# Patient Record
Sex: Male | Born: 1940 | Hispanic: No | Marital: Single | Smoking: Never smoker
Health system: Southern US, Community
[De-identification: ages and names within clinical notes are randomized; demographics above are authoritative.]

## PROBLEM LIST (undated history)

## (undated) DIAGNOSIS — I10 Essential (primary) hypertension: Secondary | ICD-10-CM

## (undated) DIAGNOSIS — I82409 Acute embolism and thrombosis of unspecified deep veins of unspecified lower extremity: Secondary | ICD-10-CM

## (undated) DIAGNOSIS — I509 Heart failure, unspecified: Secondary | ICD-10-CM

## (undated) DIAGNOSIS — I2699 Other pulmonary embolism without acute cor pulmonale: Secondary | ICD-10-CM

## (undated) DIAGNOSIS — IMO0001 Reserved for inherently not codable concepts without codable children: Secondary | ICD-10-CM

## (undated) DIAGNOSIS — C6990 Malignant neoplasm of unspecified site of unspecified eye: Secondary | ICD-10-CM

## (undated) DIAGNOSIS — H35371 Puckering of macula, right eye: Secondary | ICD-10-CM

## (undated) HISTORY — DX: Malignant neoplasm of unspecified site of unspecified eye: C69.90

## (undated) HISTORY — PX: CATARACT EXTRACTION: SUR2

## (undated) HISTORY — DX: Other pulmonary embolism without acute cor pulmonale: I26.99

## (undated) HISTORY — DX: Acute embolism and thrombosis of unspecified deep veins of unspecified lower extremity: I82.409

## (undated) HISTORY — PX: LUMBAR SPINE SURGERY: SHX701

## (undated) HISTORY — DX: Puckering of macula, right eye: H35.371

---

## 2003-07-22 DIAGNOSIS — C6990 Malignant neoplasm of unspecified site of unspecified eye: Secondary | ICD-10-CM

## 2003-07-22 HISTORY — DX: Malignant neoplasm of unspecified site of unspecified eye: C69.90

## 2005-04-10 ENCOUNTER — Other Ambulatory Visit: Admission: RE | Admit: 2005-04-10 | Discharge: 2005-04-10 | Payer: Self-pay | Admitting: Dermatology

## 2016-01-04 ENCOUNTER — Inpatient Hospital Stay (HOSPITAL_COMMUNITY)
Admission: AD | Admit: 2016-01-04 | Discharge: 2016-01-08 | DRG: 175 | Disposition: A | Discharge status: Skilled Nursing Facility | Payer: Medicare Other | Source: Other Acute Inpatient Hospital | Attending: Internal Medicine | Admitting: Internal Medicine

## 2016-01-04 DIAGNOSIS — I2699 Other pulmonary embolism without acute cor pulmonale: Principal | ICD-10-CM | POA: Diagnosis present

## 2016-01-04 DIAGNOSIS — I2489 Other forms of acute ischemic heart disease: Secondary | ICD-10-CM | POA: Diagnosis present

## 2016-01-04 DIAGNOSIS — R0902 Hypoxemia: Secondary | ICD-10-CM | POA: Diagnosis not present

## 2016-01-04 DIAGNOSIS — E876 Hypokalemia: Secondary | ICD-10-CM | POA: Diagnosis present

## 2016-01-04 DIAGNOSIS — R0600 Dyspnea, unspecified: Secondary | ICD-10-CM | POA: Diagnosis not present

## 2016-01-04 DIAGNOSIS — R739 Hyperglycemia, unspecified: Secondary | ICD-10-CM | POA: Diagnosis present

## 2016-01-04 DIAGNOSIS — I440 Atrioventricular block, first degree: Secondary | ICD-10-CM | POA: Diagnosis present

## 2016-01-04 DIAGNOSIS — I82401 Acute embolism and thrombosis of unspecified deep veins of right lower extremity: Secondary | ICD-10-CM | POA: Diagnosis present

## 2016-01-04 DIAGNOSIS — R7989 Other specified abnormal findings of blood chemistry: Secondary | ICD-10-CM | POA: Diagnosis present

## 2016-01-04 DIAGNOSIS — Z79899 Other long term (current) drug therapy: Secondary | ICD-10-CM

## 2016-01-04 DIAGNOSIS — I10 Essential (primary) hypertension: Secondary | ICD-10-CM | POA: Diagnosis not present

## 2016-01-04 DIAGNOSIS — R7303 Prediabetes: Secondary | ICD-10-CM | POA: Diagnosis present

## 2016-01-04 DIAGNOSIS — I248 Other forms of acute ischemic heart disease: Secondary | ICD-10-CM | POA: Diagnosis present

## 2016-01-04 DIAGNOSIS — J9601 Acute respiratory failure with hypoxia: Secondary | ICD-10-CM | POA: Diagnosis present

## 2016-01-04 DIAGNOSIS — H269 Unspecified cataract: Secondary | ICD-10-CM | POA: Diagnosis present

## 2016-01-04 DIAGNOSIS — R0602 Shortness of breath: Secondary | ICD-10-CM | POA: Diagnosis not present

## 2016-01-04 DIAGNOSIS — J810 Acute pulmonary edema: Secondary | ICD-10-CM | POA: Diagnosis not present

## 2016-01-04 DIAGNOSIS — R778 Other specified abnormalities of plasma proteins: Secondary | ICD-10-CM | POA: Diagnosis present

## 2016-01-04 HISTORY — DX: Essential (primary) hypertension: I10

## 2016-01-04 HISTORY — DX: Reserved for inherently not codable concepts without codable children: IMO0001

## 2016-01-04 HISTORY — DX: Heart failure, unspecified: I50.9

## 2016-01-04 LAB — MRSA PCR SCREENING: MRSA by PCR: NEGATIVE

## 2016-01-04 MED ORDER — FUROSEMIDE 10 MG/ML IJ SOLN
20.0000 mg | Freq: Once | INTRAMUSCULAR | Status: AC
  Administered 2016-01-05: 20 mg via INTRAVENOUS
  Filled 2016-01-04: qty 2

## 2016-01-04 MED ORDER — HEPARIN (PORCINE) IN NACL 100-0.45 UNIT/ML-% IJ SOLN
1950.0000 [IU]/h | INTRAMUSCULAR | Status: DC
  Administered 2016-01-05 (×2): 1800 [IU]/h via INTRAVENOUS
  Filled 2016-01-04: qty 250

## 2016-01-04 MED ORDER — SODIUM CHLORIDE 0.9 % IV SOLN
INTRAVENOUS | Status: DC
  Administered 2016-01-04 – 2016-01-07 (×3): via INTRAVENOUS

## 2016-01-04 MED ORDER — ACETAMINOPHEN 325 MG PO TABS: 650.0000 mg | ORAL_TABLET | Freq: Four times a day (QID) | ORAL | Status: DC | PRN

## 2016-01-04 NOTE — Progress Notes (Signed)
ANTICOAGULATION CONSULT NOTE - Initial Consult  Pharmacy Consult for Heparin Indication: pulmonary embolus  No Known Allergies  Patient Measurements: Height: 6\' 1"  (185.4 cm) Weight: 250 lb (113.399 kg) IBW/kg (Calculated) : 79.9 Heparin Dosing Weight:  104 kg  Vital Signs:    Labs: No results for input(s): HGB, HCT, PLT, APTT, LABPROT, INR, HEPARINUNFRC, HEPRLOWMOCWT, CREATININE, CKTOTAL, CKMB, TROPONINI in the last 72 hours.  CrCl cannot be calculated (Patient has no serum creatinine result on file.).   Medical History: No past medical history on file.  Medications:  See med rec  Assessment: Pt presented to [facility] (from [facility] rehab) via EMS c/o SOB.  Patient had recent back surgery June 5 and has been in rehab.  Estensive PE with R heart strain.Received Lovenox 113mg  6/16 at 0955. Then patient was placed on a heparin drip between 1730-1800 at a rate of 1800 units/hr prior to transfer. Patient transferred to [facility] for evaluation of EKOS.  PMH: HTN, neruopathy, macular pucker, cataracts, R eye cancer,   Labs from [facility]: Scr 1.01, Alkphos 95 high, Ca 8.2 (albumin 3.1), Glucose 173, WBC 16.4, H/H 13.5/39.5, Plts 227, BNP 3967   Goal of Therapy:  Heparin level 0.3-0.7 units/ml Monitor platelets by anticoagulation protocol: Yes   Plan:  Continue heparin at 1800 units/hr (17 units/kg/hr) Will check heparin level at 0100 Daily HL and CBC F/u evaluation for EKOS   Hilberto Burzynski S. Alford Highland, PharmD, BCPS Clinical Staff Pharmacist Pager [phone]  Eilene Ghazi Stillinger 01/04/2016,8:15 PM

## 2016-01-04 NOTE — H&P (Signed)
PULMONARY / CRITICAL CARE MEDICINE   Name: Liban Joya MRN: [id number] DOB: 08-09-1940    ADMISSION DATE:  01/04/2016 CONSULTATION DATE:  01/04/16  REFERRING MD:  n/a  CHIEF COMPLAINT:  Shortness of breath  HISTORY OF PRESENT ILLNESS:   Mr. Urrego is a pleasant 79M who presents as a transfer from OSH for shortness of breath and acute PE with imaging findings concerning for right heart strain. He reports that he had spinal surgery (L3-4 laminectomy it sounds like) 11 days ago. Post-operatively he went to a sub-acute rehab facility where he was doing well until 6/14, when there was an abrupt change and he was not able to walk the hallway using a walker as he had been doing up to that point. Over the next 48h he had increasing shortness of breath. Over the last week he noted increasing bilateral lower extremity edema. He was so short of breath today that he couldn't get out of bed. Nursing staff noted sats in the 80s and called EMS. At the outside hospital CTA PE was done which revealed extensive bilateral pulmonary emboli. BNP was markedly elevated at 3967, but troponin was <0.01. He was started on heparin and transferred here for consideration of advanced therapies.  He denies any syncopal event or loss of consciousness. At no time was he told his blood pressure was low. He is currently mildly tachycardic in the low 100s, hemodynamically stable and saturating well on 3L [place]. He denies pain with deep inspiration. No fever / chills / nausea / vomiting. He has been a little constipated. No urinary issues. No neurologic symptoms. No hemoptysis.   PAST MEDICAL HISTORY :  He  has no past medical history on file.  HTN  PAST SURGICAL HISTORY: He  has no past surgical history on file.  Lumbar laminectomy Appendectomy  Left inguinal hernia repair Cataract R eye  No Known Allergies  No current facility-administered medications on file prior to encounter.   No current outpatient prescriptions on  file prior to encounter.  Folic acid 400mg  daily Gabapentin 300 mg po BID Lisinopril 10 mg daily Vitamin d3 1000 unit daily Vitamin c daily Zinc gluconate 50 mg daily MVI daily Tylenol 650mg  as needed Oxycodone-APAP 5/325 ii tab q6h po prn Docusate Milk of mag   FAMILY HISTORY:  His has no family status information on file.  No smoking, alcohol, illicits  SOCIAL HISTORY: He  is married, lives with his wife. Active.   REVIEW OF SYSTEMS:   General: intermittent chills. no weight change, no fever,  Cardiovascular: mild chest discomfort with coughing. no palpitations, orthopnea, or PND Respiratory: cough. no sputum production, shortness of breath Gastrointestinal: + constipation no nausea, vomiting, diarrhea,  Genitourinary: no pain with urination, no foul odor, no frequency, no urgency Musculoskeletal: no joint pain or swelling, no muscle aches Skin: no rashes, sores, or ulcers Endocrine: no blood sugar problems, no thyroid problems Neurologic: no numbness, tingling, dizziness, or visual changes Hematologic: no bleeding, bruising, or clotting problems; no prior dvt Psychiatric: no depression or anxiety, no suicidal ideation or intent   SUBJECTIVE:  See HPI  VITAL SIGNS: BP 149/74 mmHg  Pulse 100  Temp(Src) 97.7 F (36.5 C) (Oral)  Resp 26  Ht 6\' 1"  (1.854 m)  Wt 113.399 kg (250 lb)  BMI 32.99 kg/m2  SpO2 95%  HEMODYNAMICS:    VENTILATOR SETTINGS:    INTAKE / OUTPUT:    PHYSICAL EXAMINATION:  General Well nourished, well developed, no apparent distress  HEENT No  gross abnormalities. Oropharynx clear. Mallampati III. Adequate dentition.   Pulmonary Clear to auscultation bilaterally with no wheezes, rales or ronchi. Good effort, symmetrical expansion.   Cardiovascular Mildly tachy (100s), regular rhythm. S1, s2. No m/r/g. Distal pulses palpable.  Abdomen Soft, non-tender, non-distended, positive bowel sounds, no palpable organomegaly or masses. Normoresonant  to percussion.  Musculoskeletal Moves all extremities. No gross abnormalities. Lumbar surgical site not evaluated.  Lymphatics No cervical, supraclavicular or axillary adenopathy.   Neurologic Grossly intact. No focal deficits. Strength 5/5 throughout.   Skin/Integuement No rash, no cyanosis, no clubbing. 1+ edema bilateral lower extremities to knees.     LABS:  pending  BMET No results for input(s): NA, K, CL, CO2, BUN, CREATININE, GLUCOSE in the last 168 hours.  Electrolytes No results for input(s): CALCIUM, MG, PHOS in the last 168 hours.  CBC No results for input(s): WBC, HGB, HCT, PLT in the last 168 hours.  Coag's No results for input(s): APTT, INR in the last 168 hours.  Sepsis Markers No results for input(s): LATICACIDVEN, PROCALCITON, O2SATVEN in the last 168 hours.  ABG No results for input(s): PHART, PCO2ART, PO2ART in the last 168 hours.  Liver Enzymes No results for input(s): AST, ALT, ALKPHOS, BILITOT, ALBUMIN in the last 168 hours.  Cardiac Enzymes No results for input(s): TROPONINI, PROBNP in the last 168 hours.  Glucose No results for input(s): GLUCAP in the last 168 hours.  Imaging No results found.   STUDIES:  CTA PE from OSH 6/16 Conclusion:  Acute PE with CT evidence of right heart strain (RV/LV ratio 1.4) consistent with intermediate risk PE. The presence of right heart strain has been associated with an increased risk of morbidity and mortality. PE arises from the proximal left main pulmonary artery and the distal right main pulmonary artery with pulmonary emboli in multiple peripheral branches bilaterally. Right pleural effusion with right base airspace consolidation.  No adenopathy.   CULTURES: None  ANTIBIOTICS: None  SIGNIFICANT EVENTS:   LINES/TUBES: PIV  DISCUSSION: Mr. Mandella is a 76M with subacute pulmonary emboli (per history likely evident since 6/14). CT showed some evidence of right heart strain, echo has yet to be  obtained. He is currently on a heparin gtt. He has significant clot burden, but had spinal surgery less than 2 weeks ago. From the available history, despite his clot burden, he does not fulfill criteria for submassive PE, as there was no hemodynamic instability or equivalent. Given his recent surgery, TPA is relatively contraindicated.   ASSESSMENT / PLAN:  PULMONARY A: Acute hypoxemic respiratory failure 2/2 PE Subacute PE P:   Continue supplemental oxygen Continue therapeutic heparin gtt TTE    CARDIOVASCULAR A:  Acute heart failure exacerbation (BNP >3500); doubt BNP elevation is fully explained by PE, particularly with negative troponin R heart strain by CT criteria Hx HTN P:  Obtain TTE Diurese as tolerated by renal function and hemodynamics  RENAL A:   No acute issues (Cr 1.01 @ OSH, BUN 13) P:   Monitor  GASTROINTESTINAL A:   No acute issues P:   Regular diet  HEMATOLOGIC A:   Acute PE  P:  Continue therapeutic heparin gtt LE dopplers to evaluate for additional clot burden  INFECTIOUS A:   No acute issues; leukocytosis of 16.4 at OSH likely represents stress response.  P:   No abx at this time.  ENDOCRINE A:   No acute issues P:     NEUROLOGIC A:   Recent L3-4 laminectomy (12/24/15) P:  RASS goal: 0 Monitor for s/s of surgical site complications while on heparin gtt   FAMILY  - Updates: No family at bedside; lengthy discussion with patient.   - Inter-disciplinary family meet or Palliative Care meeting due by:  day 7  The patient is critically ill with multiple organ system failure and requires high complexity decision making for assessment and support, frequent evaluation and titration of therapies, advanced monitoring, review of radiographic studies and interpretation of complex data.   Critical Care Time devoted to patient care services, exclusive of separately billable procedures, described in this note is 32 minutes.   Yisroel Ramming, MD Pulmonary and [place] Pager: [phone]  01/04/2016, 10:03 PM

## 2016-01-04 NOTE — Progress Notes (Signed)
Pt received to 2C-5 via carelink on stretcher. Safely transferred to bed and connected to monitor. Lines in tact. Heparin infusing at 18units. Pt GCS 15, HDS, no s/s of resp distress on 3LNC. Please see doc flowsheets for full assessment. This RN will continue to monitor

## 2016-01-05 ENCOUNTER — Encounter (HOSPITAL_COMMUNITY): Payer: Self-pay | Admitting: *Deleted

## 2016-01-05 ENCOUNTER — Inpatient Hospital Stay (HOSPITAL_COMMUNITY): Payer: Medicare Other

## 2016-01-05 DIAGNOSIS — I2699 Other pulmonary embolism without acute cor pulmonale: Principal | ICD-10-CM

## 2016-01-05 DIAGNOSIS — R0902 Hypoxemia: Secondary | ICD-10-CM

## 2016-01-05 DIAGNOSIS — J810 Acute pulmonary edema: Secondary | ICD-10-CM

## 2016-01-05 DIAGNOSIS — R0602 Shortness of breath: Secondary | ICD-10-CM

## 2016-01-05 LAB — CBC
HEMATOCRIT: 36.5 % — AB (ref 39.0–52.0)
HEMOGLOBIN: 11.9 g/dL — AB (ref 13.0–17.0)
MCH: 27.4 pg (ref 26.0–34.0)
MCHC: 32.6 g/dL (ref 30.0–36.0)
MCV: 84.1 fL (ref 78.0–100.0)
Platelets: 204 10*3/uL (ref 150–400)
RBC: 4.34 MIL/uL (ref 4.22–5.81)
RDW: 13.7 % (ref 11.5–15.5)
WBC: 10.6 10*3/uL — ABNORMAL HIGH (ref 4.0–10.5)

## 2016-01-05 LAB — COMPREHENSIVE METABOLIC PANEL
ALBUMIN: 2.3 g/dL — AB (ref 3.5–5.0)
ALT: 84 U/L — ABNORMAL HIGH (ref 17–63)
AST: 87 U/L — ABNORMAL HIGH (ref 15–41)
Alkaline Phosphatase: 87 U/L (ref 38–126)
Anion gap: 9 (ref 5–15)
BILIRUBIN TOTAL: 0.8 mg/dL (ref 0.3–1.2)
BUN: 10 mg/dL (ref 6–20)
CHLORIDE: 104 mmol/L (ref 101–111)
CO2: 24 mmol/L (ref 22–32)
Calcium: 7.7 mg/dL — ABNORMAL LOW (ref 8.9–10.3)
Creatinine, Ser: 0.98 mg/dL (ref 0.61–1.24)
GFR calc Af Amer: >60 mL/min (ref >60)
GFR calc non Af Amer: >60 mL/min (ref >60)
GLUCOSE: 142 mg/dL — AB (ref 65–99)
POTASSIUM: 3.1 mmol/L — AB (ref 3.5–5.1)
SODIUM: 137 mmol/L (ref 135–145)
Total Protein: 5.5 g/dL — ABNORMAL LOW (ref 6.5–8.1)

## 2016-01-05 LAB — ECHOCARDIOGRAM COMPLETE
CHL CUP MV DEC (S): 156
E/e' ratio: 6.56
EWDT: 156 ms
FS: 20 % — AB (ref 28–44)
HEIGHTINCHES: 73 in
IVS/LV PW RATIO, ED: 0.8
LA diam end sys: 37 mm
LADIAMINDEX: 1.43 cm/m2
LASIZE: 37 mm
LAVOL: 57.4 mL
LAVOLA4C: 64.5 mL
LAVOLIN: 22.1 mL/m2
LV TDI E'MEDIAL: 8.49
LV e' LATERAL: 9.46 cm/s
LVEEAVG: 6.56
LVEEMED: 6.56
LVOT area: 4.52 cm2
LVOT diameter: 24 mm
Lateral S' vel: 13.3 cm/s
MV pk E vel: 62.1 m/s
MVPKAVEL: 74.2 m/s
PW: 12.6 mm — AB (ref 0.6–1.1)
RV sys press: 56 mmHg
Reg peak vel: 345 cm/s
TAPSE: 16.9 mm
TDI e' lateral: 9.46
TR max vel: 345 cm/s
WEIGHTICAEL: 4451.2 [oz_av]

## 2016-01-05 LAB — HEPARIN LEVEL (UNFRACTIONATED)
HEPARIN UNFRACTIONATED: 0.49 [IU]/mL (ref 0.30–0.70)
Heparin Unfractionated: 0.4 IU/mL (ref 0.30–0.70)
Heparin Unfractionated: 0.33 IU/mL (ref 0.30–0.70)

## 2016-01-05 LAB — TROPONIN I
Troponin I: 0.07 ng/mL — ABNORMAL HIGH (ref <0.031)
Troponin I: 0.07 ng/mL — ABNORMAL HIGH (ref <0.031)

## 2016-01-05 MED ORDER — PERFLUTREN LIPID MICROSPHERE
1.0000 mL | INTRAVENOUS | Status: AC | PRN
  Administered 2016-01-05: 3 mL via INTRAVENOUS
  Filled 2016-01-05: qty 10

## 2016-01-05 MED ORDER — POTASSIUM CHLORIDE CRYS ER 20 MEQ PO TBCR
40.0000 meq | EXTENDED_RELEASE_TABLET | Freq: Three times a day (TID) | ORAL | Status: AC
  Administered 2016-01-05 (×2): 40 meq via ORAL
  Filled 2016-01-05 (×2): qty 2

## 2016-01-05 MED ORDER — POTASSIUM CHLORIDE CRYS ER 20 MEQ PO TBCR
40.0000 meq | EXTENDED_RELEASE_TABLET | Freq: Once | ORAL | Status: AC
  Administered 2016-01-05: 40 meq via ORAL
  Filled 2016-01-05: qty 2

## 2016-01-05 MED ORDER — FUROSEMIDE 10 MG/ML IJ SOLN
20.0000 mg | Freq: Three times a day (TID) | INTRAMUSCULAR | Status: AC
  Administered 2016-01-05 (×2): 20 mg via INTRAVENOUS
  Filled 2016-01-05 (×3): qty 2

## 2016-01-05 MED ORDER — HEPARIN (PORCINE) IN NACL 100-0.45 UNIT/ML-% IJ SOLN
2050.0000 [IU]/h | INTRAMUSCULAR | Status: DC
  Administered 2016-01-05 – 2016-01-06 (×2): 2050 [IU]/h via INTRAVENOUS
  Filled 2016-01-05 (×2): qty 250

## 2016-01-05 NOTE — Progress Notes (Signed)
ANTICOAGULATION CONSULT NOTE - Follow Up Consult  Pharmacy Consult for heparin Indication: pulmonary embolus  Labs:  Recent Labs  01/04/16 0030  HEPARINUNFRC 0.49     Assessment/Plan:  75yo male therapeutic on heparin with initial dosing for PE (note, lab dated as 6/16 at 0030 but was actually drawn 6/17 0030). Will continue gtt at current rate and confirm stable with additional level.   Wynona Neat, PharmD, BCPS  01/05/2016,1:07 AM

## 2016-01-05 NOTE — Progress Notes (Signed)
[place] Progress Note Patient Name: Mike Davis DOB: 03-Nov-1940 MRN: [id number]   Date of Service  01/05/2016  HPI/Events of Note  hypokalemia  eICU Interventions  Potassium replaced     Intervention Category Intermediate Interventions: Electrolyte abnormality - evaluation and management  Mike Davis 01/05/2016, 4:28 AM

## 2016-01-05 NOTE — Plan of Care (Signed)
Problem: Education: Goal: Knowledge of [place] [facility] Education information/materials will improve Outcome: Progressing Discussed reason for 12 lead EKG being performed.

## 2016-01-05 NOTE — Progress Notes (Signed)
  Echocardiogram 2D Echocardiogram has been performed.  Mike Davis 01/05/2016, 5:45 PM

## 2016-01-05 NOTE — Progress Notes (Signed)
PULMONARY / CRITICAL CARE MEDICINE   Name: Mike Davis MRN: [id number] DOB: Dec 16, 1940    ADMISSION DATE:  01/04/2016 CONSULTATION DATE:  01/04/16  REFERRING MD:  n/a  CHIEF COMPLAINT:  Shortness of breath  HISTORY OF PRESENT ILLNESS:   Mike Davis is a pleasant 52M who presents as a transfer from OSH for shortness of breath and acute PE with imaging findings concerning for right heart strain. He reports that he had spinal surgery (L3-4 laminectomy it sounds like) 11 days ago. Post-operatively he went to a sub-acute rehab facility where he was doing well until 6/14, when there was an abrupt change and he was not able to walk the hallway using a walker as he had been doing up to that point. Over the next 48h he had increasing shortness of breath. Over the last week he noted increasing bilateral lower extremity edema. He was so short of breath today that he couldn't get out of bed. Nursing staff noted sats in the 80s and called EMS. At the outside hospital CTA PE was done which revealed extensive bilateral pulmonary emboli. BNP was markedly elevated at 3967, but troponin was <0.01. He was started on heparin and transferred here for consideration of advanced therapies.  He denies any syncopal event or loss of consciousness. At no time was he told his blood pressure was low. He is currently mildly tachycardic in the low 100s, hemodynamically stable and saturating well on 3L [place]. He denies pain with deep inspiration. No fever / chills / nausea / vomiting. He has been a little constipated. No urinary issues. No neurologic symptoms. No hemoptysis.   SUBJECTIVE:  No events overnight.  VITAL SIGNS: BP 133/78 mmHg  Pulse 96  Temp(Src) 98 F (36.7 C) (Oral)  Resp 27  Ht 6\' 1"  (1.854 m)  Wt 126.191 kg (278 lb 3.2 oz)  BMI 36.71 kg/m2  SpO2 93%  HEMODYNAMICS:    VENTILATOR SETTINGS:    INTAKE / OUTPUT: I/O last 3 completed shifts: In: 753.2 [P.O.:120; I.V.:633.2] Out: 1425  [Urine:1425]  PHYSICAL EXAMINATION:  General Well nourished, well developed, no apparent distress  HEENT No gross abnormalities. Oropharynx clear. Mallampati III. Adequate dentition.   Pulmonary Clear to auscultation bilaterally with no wheezes, rales or ronchi. Good effort, symmetrical expansion.   Cardiovascular Mildly tachy (100s), regular rhythm. S1, s2. No m/r/g. Distal pulses palpable.  Abdomen Soft, non-tender, non-distended, positive bowel sounds, no palpable organomegaly or masses. Normoresonant to percussion.  Musculoskeletal Moves all extremities. No gross abnormalities. Lumbar surgical site not evaluated.  Lymphatics No cervical, supraclavicular or axillary adenopathy.   Neurologic Grossly intact. No focal deficits. Strength 5/5 throughout.   Skin/Integuement No rash, no cyanosis, no clubbing. 1+ edema bilateral lower extremities to knees.     LABS:  pending  BMET  Recent Labs Lab 01/05/16 0245  NA 137  K 3.1*  CL 104  CO2 24  BUN 10  CREATININE 0.98  GLUCOSE 142*    Electrolytes  Recent Labs Lab 01/05/16 0245  CALCIUM 7.7*    CBC  Recent Labs Lab 01/05/16 0245  WBC 10.6*  HGB 11.9*  HCT 36.5*  PLT 204    Coag's No results for input(s): APTT, INR in the last 168 hours.  Sepsis Markers No results for input(s): LATICACIDVEN, PROCALCITON, O2SATVEN in the last 168 hours.  ABG No results for input(s): PHART, PCO2ART, PO2ART in the last 168 hours.  Liver Enzymes  Recent Labs Lab 01/05/16 0245  AST 87*  ALT 84*  ALKPHOS 87  BILITOT 0.8  ALBUMIN 2.3*    Cardiac Enzymes  Recent Labs Lab 01/04/16 0030 01/05/16 0600  TROPONINI 0.07* 0.07*    Glucose No results for input(s): GLUCAP in the last 168 hours.  Imaging No results found.   STUDIES:  CTA PE from OSH 6/16 Conclusion:  Acute PE with CT evidence of right heart strain (RV/LV ratio 1.4) consistent with intermediate risk PE. The presence of right heart strain has been  associated with an increased risk of morbidity and mortality. PE arises from the proximal left main pulmonary artery and the distal right main pulmonary artery with pulmonary emboli in multiple peripheral branches bilaterally. Right pleural effusion with right base airspace consolidation.  No adenopathy.   CULTURES: None  ANTIBIOTICS: None  SIGNIFICANT EVENTS:   LINES/TUBES: PIV  DISCUSSION: Mike Davis is a 6M with subacute pulmonary emboli (per history likely evident since 6/14). CT showed some evidence of right heart strain, echo has yet to be obtained. He is currently on a heparin gtt. He has significant clot burden, but had spinal surgery less than 2 weeks ago. From the available history, despite his clot burden, he does not fulfill criteria for submassive PE, as there was no hemodynamic instability or equivalent. Given his recent surgery, TPA is relatively contraindicated.   ASSESSMENT / PLAN:  PULMONARY A: Acute hypoxemic respiratory failure 2/2 PE Subacute PE P:   Continue supplemental oxygen Continue therapeutic heparin gtt TTE pending  CARDIOVASCULAR A:  Acute heart failure exacerbation (BNP >3500); doubt BNP elevation is fully explained by PE, particularly with negative troponin R heart strain by CT criteria Hx HTN First degree av block on EKG that I reviewed myself. P:  TTE, will reorder Diurese as tolerated by renal function and hemodynamics  RENAL A:   No acute issues (Cr 1.01 @ OSH, BUN 13) P:   Monitor Will give 2 doses of 20 of lasix today (concern for BP and right heart failure) 2 doses of K-dur 40 meq PO given  GASTROINTESTINAL A:   No acute issues P:   Regular diet  HEMATOLOGIC A:   Acute PE  P:  Continue therapeutic heparin gtt LE dopplers to evaluate for additional clot burden, pending Patient to decide on coumadin vs NOAC  INFECTIOUS A:   No acute issues; leukocytosis of 16.4 at OSH likely represents stress response.  P:   No  abx at this time.  ENDOCRINE A:   No acute issues P:   Monitor  NEUROLOGIC A:   Recent L3-4 laminectomy (12/24/15) P:   RASS goal: 0 Monitor for s/s of surgical site complications while on heparin gtt Minimize narcotics  Discussed with PCCM-NP.  Rush Farmer, M.D. [facility] Pulmonary/Critical Care Medicine. Pager: [phone]. After hours pager: [phone].  01/05/2016, 3:32 PM

## 2016-01-05 NOTE — Progress Notes (Signed)
ANTICOAGULATION CONSULT NOTE - FOLLOW UP    HL = 0.33 (goal 0.3 - 0.7 units/mL) Heparin dosing weight = 104 kg   Assessment: [name] with history of spinal surgery about 2 weeks ago and subsequently developed a PE.  Patient continues on IV heparin.  Heparin level remains therapeutic but is trending down.  No bleeding reported.   Plan: - Increase heparin gtt to 2050 units/hr - F/U AM labs    Mike Davis, PharmD, BCPS 01/05/2016, 7:30 PM

## 2016-01-05 NOTE — Plan of Care (Signed)
Problem: Education: Goal: Knowledge of [place] [facility] Education information/materials will improve Outcome: Progressing Discussed the reasons for increasing his Heparin drip rate from pharmacy.  And patient would prefer an air mattress bed for previous back surgery.

## 2016-01-05 NOTE — Progress Notes (Signed)
ANTICOAGULATION CONSULT NOTE - Follow Up Consult  Pharmacy Consult for heparin Indication: pulmonary embolus  Labs:  Recent Labs  01/04/16 0030 01/05/16 0245 01/05/16 0600  HGB  --  11.9*  --   HCT  --  36.5*  --   PLT  --  204  --   HEPARINUNFRC 0.49  --  0.40  CREATININE  --  0.98  --   TROPONINI 0.07*  --  0.07*   Assessment:  75yo male therapeutic on heparin with initial dosing for PE.  He has trended downward since initial dosing.  Would like to see him at the higher end of range with PE.  Reviewed AM labs and his CBC is unremarkable and platelets remain WNL.  He had spinal surgery ~ 2 weeks ago and subsequently developed PE.  There is concern for right heart strain and he is mildly tachy, requiring 3L [place] for respiratory support.  GOAL Heparin level 0.5-0.7   Plan: Increase IV Heparin to 1950 units/hr without a bolus given recent surgery Check Heparin level in 6 hours to see if he is in target range Monitor for s/s of bleeding complications  Rober Minion, PharmD., MS Clinical Pharmacist Pager:  [phone] Thank you for allowing pharmacy to be part of this patients care team. 01/05/2016,10:54 AM

## 2016-01-06 ENCOUNTER — Inpatient Hospital Stay (HOSPITAL_COMMUNITY): Payer: Medicare Other

## 2016-01-06 ENCOUNTER — Encounter (HOSPITAL_COMMUNITY): Payer: Self-pay

## 2016-01-06 DIAGNOSIS — E876 Hypokalemia: Secondary | ICD-10-CM | POA: Diagnosis present

## 2016-01-06 DIAGNOSIS — R739 Hyperglycemia, unspecified: Secondary | ICD-10-CM | POA: Diagnosis present

## 2016-01-06 DIAGNOSIS — R7989 Other specified abnormal findings of blood chemistry: Secondary | ICD-10-CM | POA: Diagnosis present

## 2016-01-06 DIAGNOSIS — I248 Other forms of acute ischemic heart disease: Secondary | ICD-10-CM | POA: Diagnosis present

## 2016-01-06 DIAGNOSIS — R778 Other specified abnormalities of plasma proteins: Secondary | ICD-10-CM | POA: Diagnosis present

## 2016-01-06 DIAGNOSIS — I10 Essential (primary) hypertension: Secondary | ICD-10-CM | POA: Diagnosis present

## 2016-01-06 LAB — PHOSPHORUS: Phosphorus: 2.5 mg/dL (ref 2.5–4.6)

## 2016-01-06 LAB — BASIC METABOLIC PANEL
ANION GAP: 11 (ref 5–15)
BUN: 14 mg/dL (ref 6–20)
CO2: 22 mmol/L (ref 22–32)
Calcium: 7.8 mg/dL — ABNORMAL LOW (ref 8.9–10.3)
Chloride: 105 mmol/L (ref 101–111)
Creatinine, Ser: 0.91 mg/dL (ref 0.61–1.24)
GFR calc Af Amer: >60 mL/min (ref >60)
GFR calc non Af Amer: >60 mL/min (ref >60)
GLUCOSE: 131 mg/dL — AB (ref 65–99)
POTASSIUM: 3.5 mmol/L (ref 3.5–5.1)
Sodium: 138 mmol/L (ref 135–145)

## 2016-01-06 LAB — CBC
HEMATOCRIT: 35.1 % — AB (ref 39.0–52.0)
Hemoglobin: 11.5 g/dL — ABNORMAL LOW (ref 13.0–17.0)
MCH: 27.6 pg (ref 26.0–34.0)
MCHC: 32.8 g/dL (ref 30.0–36.0)
MCV: 84.2 fL (ref 78.0–100.0)
PLATELETS: 258 10*3/uL (ref 150–400)
RBC: 4.17 MIL/uL — AB (ref 4.22–5.81)
RDW: 13.7 % (ref 11.5–15.5)
WBC: 8.6 10*3/uL (ref 4.0–10.5)

## 2016-01-06 LAB — MAGNESIUM: Magnesium: 2 mg/dL (ref 1.7–2.4)

## 2016-01-06 LAB — HEPARIN LEVEL (UNFRACTIONATED): Heparin Unfractionated: 0.37 IU/mL (ref 0.30–0.70)

## 2016-01-06 MED ORDER — APIXABAN 5 MG PO TABS
10.0000 mg | ORAL_TABLET | Freq: Two times a day (BID) | ORAL | Status: DC
  Administered 2016-01-06 – 2016-01-08 (×5): 10 mg via ORAL
  Filled 2016-01-06 (×5): qty 2

## 2016-01-06 MED ORDER — APIXABAN 5 MG PO TABS: 5.0000 mg | ORAL_TABLET | Freq: Two times a day (BID) | ORAL | Status: DC

## 2016-01-06 NOTE — Progress Notes (Signed)
Progress Note    Mike Davis  [id number] DOB: Jul 08, 1941  DOA: 01/04/2016 PCP: No primary care provider on file.    Brief Narrative:   Mike Davis is an 75 y.o. male with a PMH of hypertension who was admitted by the critical care team on 01/04/16 for treatment of pulmonary embolism with right heart strain status post recent L3-4 laminectomy.  Assessment/Plan:   Principal Problem:   Pulmonary embolism ([place]) with right heart strain and demand ischemia/elevated troponin Patient is currently being managed on a heparin drip. Continue supplemental oxygen. 2-D echo done 01/05/16, EF 60-65 percent, no regional wall motion abnormalities. There was moderately increased pulmonary artery systolic pressure at 56 mm Hg. follow-up lower extremity Dopplers to assess for additional clot burden. Long discussion held with patient regarding blood thinning options.  D/C heparin and start Eliquis.  Active Problems:   Essential hypertension S/P IV Lasix, 20 mg, every 8 hours x 3. Home medications include lisinopril and metoprolol which are on hold at present. Will resume 01/07/16 if BP stable.    Hypokalemia Resolved with supplementation.    Hyperglycemia Check hemoglobin A1c.   Family Communication/Anticipated D/C date and plan/Code Status   DVT prophylaxis: Therapeutic dose heparin. Code Status: Full Code.  Family Communication: No family at the bedside. Disposition Plan: Possibly home 01/07/16 if stable.   Medical Consultants:    None.   Procedures:    Anti-Infectives:   Anti-infectives    None      Subjective:   Mike Davis denies chest pain/dyspnea.  Appetite is fair.    Objective:    Filed Vitals:   01/06/16 0400 01/06/16 0412 01/06/16 0500 01/06/16 0600  BP: 132/78  134/69 139/71  Pulse: 84  81 84  Temp:  97.7 F (36.5 C)    TempSrc:  Oral    Resp: 25   25  Height:      Weight:  124.739 kg (275 lb)    SpO2: 95%  95% 95%    Intake/Output Summary  (Last 24 hours) at 01/06/16 0733 Last data filed at 01/06/16 0600  Gross per 24 hour  Intake 2269.26 ml  Output   2050 ml  Net 219.26 ml   Filed Weights   01/04/16 2100 01/05/16 0523 01/06/16 0412  Weight: 126.1 kg (278 lb) 126.191 kg (278 lb 3.2 oz) 124.739 kg (275 lb)    Exam: General exam: Appears calm and comfortable.  Respiratory system: Clear to auscultation. Respiratory effort normal. Cardiovascular system: S1 & S2 heard, RRR. No JVD,  rubs, gallops or clicks. No murmurs. Gastrointestinal system: Abdomen is nondistended, soft and nontender. No organomegaly or masses felt. Normal bowel sounds heard. Central nervous system: Alert and oriented. No focal neurological deficits. Extremities: No clubbing, or cyanosis. 1+ BLE edema. Skin: No rashes, lesions or ulcers Psychiatry: Judgement and insight appear normal. Mood & affect appropriate.   Data Reviewed:   I have personally reviewed following labs and imaging studies:  Labs: Basic Metabolic Panel:  Recent Labs Lab 01/05/16 0245 01/06/16 0350  NA 137 138  K 3.1* 3.5  CL 104 105  CO2 24 22  GLUCOSE 142* 131*  BUN 10 14  CREATININE 0.98 0.91  CALCIUM 7.7* 7.8*  MG  --  2.0  PHOS  --  2.5   GFR Estimated Creatinine Clearance: 98.5 mL/min (by C-G formula based on Cr of 0.91). Liver Function Tests:  Recent Labs Lab 01/05/16 0245  AST 87*  ALT 84*  ALKPHOS 87  BILITOT 0.8  PROT 5.5*  ALBUMIN 2.3*   CBC:  Recent Labs Lab 01/05/16 0245 01/06/16 0350  WBC 10.6* 8.6  HGB 11.9* 11.5*  HCT 36.5* 35.1*  MCV 84.1 84.2  PLT 204 258   Cardiac Enzymes:  Recent Labs Lab 01/04/16 0030 01/05/16 0600  TROPONINI 0.07* 0.07*   Urine analysis: No results found for: COLORURINE, APPEARANCEUR, LABSPEC, [place], GLUCOSEU, HGBUR, BILIRUBINUR, KETONESUR, PROTEINUR, UROBILINOGEN, NITRITE, LEUKOCYTESUR Microbiology Recent Results (from the past 240 hour(s))  MRSA PCR Screening     Status: None   Collection Time:  01/04/16  9:43 PM  Result Value Ref Range Status   MRSA by PCR NEGATIVE NEGATIVE Final    Comment:        The GeneXpert MRSA Assay (FDA approved for NASAL specimens only), is one component of a comprehensive MRSA colonization surveillance program. It is not intended to diagnose MRSA infection nor to guide or monitor treatment for MRSA infections.     Radiology: No results found.  Medications:     Continuous Infusions: . sodium chloride 50 mL/hr at 01/04/16 2200  . heparin 2,050 Units/hr (01/05/16 2029)    Time spent: 35 minutes with > 50% of time discussing current diagnostic test results, clinical impression and plan of care.   LOS: 2 days   RAMA,CHRISTINA  [facility] Pager [phone]. If unable to reach me by pager, please call my cell phone at [phone].  *Please refer to [email or web address], password [email or web address] to get updated schedule on who will round on this patient, as hospitalists switch teams weekly. If 7PM-7AM, please contact night-coverage at [email or web address], password [email or web address] for any overnight needs.  01/06/2016, 7:33 AM

## 2016-01-06 NOTE — Progress Notes (Signed)
Utilization review completed.  

## 2016-01-06 NOTE — Progress Notes (Signed)
ANTICOAGULATION CONSULT NOTE - Follow Up Consult  Pharmacy Consult for heparin >>> starting Apixaban Indication: pulmonary embolus  Labs:  Recent Labs  01/04/16 0030 01/05/16 0245 01/05/16 0600 01/05/16 1816 01/06/16 0350  HGB  --  11.9*  --   --  11.5*  HCT  --  36.5*  --   --  35.1*  PLT  --  204  --   --  258  HEPARINUNFRC 0.49  --  0.40 0.33 0.37  CREATININE  --  0.98  --   --  0.91  TROPONINI 0.07*  --  0.07*  --   --    Assessment:  75yo male therapeutic on heparin with initial dosing for PE.  He has trended downward since initial dosing.  Would like to see him at the higher end of range with PE.  Reviewed AM labs and his CBC is unremarkable and platelets remain WNL.  He had spinal surgery ~ 2 weeks ago and subsequently developed PE.  There is concern for right heart strain and he is mildly tachy, requiring 3L [place] for respiratory support.  Today we will transition from IV heparin to Apixaban for continued oral maintenance therapy.  Plan: Stop heparin and begin Apixaban 10mg  bid x 7 days then 5mg  bid Monitor for s/s of bleeding complications Educate patient on the importance of compliance as well as risk factors for bleeding  Rober Minion, PharmD., MS Clinical Pharmacist Pager:  [phone] Thank you for allowing pharmacy to be part of this patients care team. 01/06/2016,10:38 AM

## 2016-01-06 NOTE — Plan of Care (Signed)
Problem: Education: Goal: Knowledge of  General Education information/materials will improve Outcome: Progressing Discussed the benefits of his air mattress

## 2016-01-07 ENCOUNTER — Inpatient Hospital Stay (HOSPITAL_COMMUNITY): Payer: Medicare Other

## 2016-01-07 DIAGNOSIS — I82401 Acute embolism and thrombosis of unspecified deep veins of right lower extremity: Secondary | ICD-10-CM | POA: Diagnosis present

## 2016-01-07 DIAGNOSIS — R7303 Prediabetes: Secondary | ICD-10-CM

## 2016-01-07 DIAGNOSIS — R0600 Dyspnea, unspecified: Secondary | ICD-10-CM

## 2016-01-07 DIAGNOSIS — I824Y1 Acute embolism and thrombosis of unspecified deep veins of right proximal lower extremity: Secondary | ICD-10-CM

## 2016-01-07 LAB — CBC
HCT: 34.6 % — ABNORMAL LOW (ref 39.0–52.0)
HEMOGLOBIN: 11.3 g/dL — AB (ref 13.0–17.0)
MCH: 28.2 pg (ref 26.0–34.0)
MCHC: 32.7 g/dL (ref 30.0–36.0)
MCV: 86.3 fL (ref 78.0–100.0)
Platelets: 262 10*3/uL (ref 150–400)
RBC: 4.01 MIL/uL — ABNORMAL LOW (ref 4.22–5.81)
RDW: 13.9 % (ref 11.5–15.5)
WBC: 8.3 10*3/uL (ref 4.0–10.5)

## 2016-01-07 LAB — BASIC METABOLIC PANEL
ANION GAP: 8 (ref 5–15)
BUN: 15 mg/dL (ref 6–20)
CALCIUM: 7.7 mg/dL — AB (ref 8.9–10.3)
CO2: 21 mmol/L — ABNORMAL LOW (ref 22–32)
CREATININE: 0.89 mg/dL (ref 0.61–1.24)
Chloride: 107 mmol/L (ref 101–111)
Glucose, Bld: 106 mg/dL — ABNORMAL HIGH (ref 65–99)
Potassium: 3.7 mmol/L (ref 3.5–5.1)
SODIUM: 136 mmol/L (ref 135–145)

## 2016-01-07 LAB — HEMOGLOBIN A1C
HEMOGLOBIN A1C: 5.9 % — AB (ref 4.8–5.6)
MEAN PLASMA GLUCOSE: 123 mg/dL

## 2016-01-07 NOTE — Progress Notes (Signed)
Progress Note    Mike Davis  [id number] DOB: 1941-05-28  DOA: 01/04/2016 PCP: No primary care provider on file.    Brief Narrative:   Mike Davis is an 75 y.o. male with a PMH of hypertension who was admitted by the critical care team on 01/04/16 for treatment of pulmonary embolism with right heart strain status post recent L3-4 laminectomy.  Assessment/Plan:   Principal Problem:   Pulmonary embolism (HCC) with right heart strain and demand ischemia/elevated troponin /Right lower extremity DVT Patient initially managed on a heparin drip. Continue supplemental oxygen. 2-D echo done 01/05/16, EF 60-65 percent, no regional wall motion abnormalities. There was moderately increased pulmonary artery systolic pressure at 56 mm Hg. follow-up lower extremity Dopplers to assess for additional clot burden. Long discussion held with patient regarding blood thinning options.  Eliquis Started 01/06/16. PT evaluation. Mobilize. Transfer to telemetry.  Active Problems:   Essential hypertension S/P IV Lasix, 20 mg, every 8 hours x 3. Home medications include lisinopril and metoprolol which are on hold at present. Blood pressure stable.    Hypokalemia Resolved with supplementation.    Hyperglycemia/prediabetes Hemoglobin A1c is 5.9%, consistent with prediabetes. Will ask dietitian to counsel the patient on appropriate diet changes to address.   Family Communication/Anticipated D/C date and plan/Code Status   DVT prophylaxis: Eliquis. Code Status: Full Code.  Family Communication: No family at the bedside. Disposition Plan: Back to SNF 01/08/16   Medical Consultants:    None.   Procedures:   2-D echo 01/05/16  Study Conclusions  - Left ventricle: The cavity size was normal. There was mild  concentric hypertrophy. Systolic function was normal. The  estimated ejection fraction was in the range of 60% to 65%. Wall  motion was normal; there were no regional wall motion   abnormalities. - Right ventricle: The cavity size was mildly dilated. Wall  thickness was normal. - Right atrium: The atrium was mildly dilated. - Pulmonary arteries: Systolic pressure was moderately increased.  PA peak pressure: 56 mm Hg (S).  Bilateral lower extremity Dopplers 01/07/16  Right: DVT noted in the posterior tibial veins and peroneal veins.  Evidence of superficial thrombosis in the small saphenous vein.   Left no evidence of DVT. No evidence of superficial thrombosis.  Bilaterally- No Baker's cyst.  Anti-Infectives:   Anti-infectives    None      Subjective:   [name] denies chest pain/dyspnea.  Appetite is fair.  Has back pain that is worse with lying flat.  Objective:    Filed Vitals:   01/07/16 0322 01/07/16 0400 01/07/16 0500 01/07/16 0600  BP:  126/69 136/70 136/70  Pulse:  69 72 71  Temp: 98.2 F (36.8 C)     TempSrc: Oral     Resp:  20 24 20   Height:      Weight: 124.739 kg (275 lb)     SpO2:  96% 96% 98%    Intake/Output Summary (Last 24 hours) at 01/07/16 0730 Last data filed at 01/07/16 0600  Gross per 24 hour  Intake 1948.58 ml  Output    950 ml  Net 998.58 ml   Filed Weights   01/05/16 0523 01/06/16 0412 01/07/16 0322  Weight: 126.191 kg (278 lb 3.2 oz) 124.739 kg (275 lb) 124.739 kg (275 lb)    Exam: General exam: Appears calm and comfortable.  Respiratory system: Clear to auscultation. Respiratory effort normal. Cardiovascular system: S1 & S2 heard, RRR. No JVD,  rubs, gallops or  clicks. No murmurs. Gastrointestinal system: Abdomen is nondistended, soft and nontender. No organomegaly or masses felt. Normal bowel sounds heard. Central nervous system: Alert and oriented. No focal neurological deficits. Extremities: No clubbing, or cyanosis. 1+ BLE edema. Skin: No rashes, lesions or ulcers Psychiatry: Judgement and insight appear normal. Mood & affect appropriate.   Data Reviewed:   I have personally reviewed  following labs and imaging studies:  Labs: Basic Metabolic Panel:  Recent Labs Lab 01/05/16 0245 01/06/16 0350 01/06/16 2358  NA 137 138 136  K 3.1* 3.5 3.7  CL 104 105 107  CO2 24 22 21*  GLUCOSE 142* 131* 106*  BUN 10 14 15   CREATININE 0.98 0.91 0.89  CALCIUM 7.7* 7.8* 7.7*  MG  --  2.0  --   PHOS  --  2.5  --    GFR Estimated Creatinine Clearance: 100.7 mL/min (by C-G formula based on Cr of 0.89). Liver Function Tests:  Recent Labs Lab 01/05/16 0245  AST 87*  ALT 84*  ALKPHOS 87  BILITOT 0.8  PROT 5.5*  ALBUMIN 2.3*   CBC:  Recent Labs Lab 01/05/16 0245 01/06/16 0350 01/06/16 2350  WBC 10.6* 8.6 8.3  HGB 11.9* 11.5* 11.3*  HCT 36.5* 35.1* 34.6*  MCV 84.1 84.2 86.3  PLT 204 258 262   Cardiac Enzymes:  Recent Labs Lab 01/04/16 0030 01/05/16 0600  TROPONINI 0.07* 0.07*   Microbiology Recent Results (from the past 240 hour(s))  MRSA PCR Screening     Status: None   Collection Time: 01/04/16  9:43 PM  Result Value Ref Range Status   MRSA by PCR NEGATIVE NEGATIVE Final    Comment:        The GeneXpert MRSA Assay (FDA approved for NASAL specimens only), is one component of a comprehensive MRSA colonization surveillance program. It is not intended to diagnose MRSA infection nor to guide or monitor treatment for MRSA infections.     Radiology: Dg Chest Port 1 View  01/06/2016  CLINICAL DATA:  Patient with hypoxia. EXAM: PORTABLE CHEST 1 VIEW COMPARISON:  Chest radiograph 01/04/2016; CT 01/04/2016. FINDINGS: Monitoring leads overlie the patient. Stable enlarged cardiac and mediastinal contours. Unchanged heterogeneous opacities right mid and lower lung. Small right pleural effusion. No pneumothorax. IMPRESSION: Heterogeneous opacities right mid and lower lung may represent combination of atelectasis, infection or pulmonary infarct. Small right pleural effusion. Electronically Signed   By: Lovey Newcomer M.D.   On: 01/06/2016 08:18    Medications:     . apixaban  10 mg Oral BID   Followed by  . [START ON 01/13/2016] apixaban  5 mg Oral BID   Continuous Infusions: . sodium chloride 50 mL/hr at 01/06/16 1326    Time spent: 35 minutes with > 50% of time discussing current diagnostic test results, clinical impression and plan of care.   LOS: 3 days   [facility] Pager [phone]. If unable to reach me by pager, please call my cell phone at [phone].  *Please refer to [email or web address], password [email or web address] to get updated schedule on who will round on this patient, as hospitalists switch teams weekly. If 7PM-7AM, please contact night-coverage at [email or web address], password [email or web address] for any overnight needs.  01/07/2016, 7:30 AM

## 2016-01-07 NOTE — NC FL2 (Signed)
  [place] LEVEL OF CARE SCREENING TOOL     IDENTIFICATION  Patient Name: Mike Davis Birthdate: Nov 26, 1940 Sex: male Admission Date (Current Location): 01/04/2016  [facility] and [place] Number:  [employer] and Address:  [facility] [place], [place] [address] [place], [place] [address]      Provider Number: [id number]  Attending Physician Name and Address:  Venetia Maxon Rama, MD  Relative Name and Phone Number:       Current Level of Care: Hospital Recommended Level of Care: [place] Prior Approval Number:    Date Approved/Denied:   PASRR Number: [id number]  Discharge Plan: SNF    Current Diagnoses: Patient Active Problem List   Diagnosis Date Noted  . Right leg DVT ([place]) 01/07/2016  . Prediabetes 01/07/2016  . Essential hypertension 01/06/2016  . Hypokalemia 01/06/2016  . Hyperglycemia 01/06/2016  . Demand ischemia ([place]) 01/06/2016  . Elevated troponin 01/06/2016  . Pulmonary embolism ([place]) 01/04/2016    Orientation RESPIRATION BLADDER Height & Weight     Self, Time, Situation, Place  Normal Continent Weight: 124.739 kg (275 lb) Height:  6\' 1"  (185.4 cm)  BEHAVIORAL SYMPTOMS/MOOD NEUROLOGICAL BOWEL NUTRITION STATUS      Continent Diet (cardiac)  AMBULATORY STATUS COMMUNICATION OF NEEDS Skin   Limited Assist Verbally Surgical wounds                       Personal Care Assistance Level of Assistance  Bathing, Dressing Bathing Assistance: Limited assistance   Dressing Assistance: Limited assistance     Functional Limitations Info             SPECIAL CARE FACTORS FREQUENCY  PT (By licensed PT), OT (By licensed OT)     PT Frequency: 5/wk OT Frequency: 5/wk            Contractures      Additional Factors Info  Code Status, Allergies Code Status Info: FULL Allergies Info: NKA           Current Medications (01/07/2016):  This is the current hospital active medication  list Current Facility-Administered Medications  Medication Dose Route Frequency Provider Last Rate Last Dose  . 0.9 %  sodium chloride infusion   Intravenous Continuous Chesley Mires, MD 50 mL/hr at 01/07/16 1358    . acetaminophen (TYLENOL) tablet 650 mg  650 mg Oral Q6H PRN Chesley Mires, MD      . apixaban (ELIQUIS) tablet 10 mg  10 mg Oral BID Durwin Nora, RPH   10 mg at 01/07/16 1012   Followed by  . [START ON 01/13/2016] apixaban (ELIQUIS) tablet 5 mg  5 mg Oral BID Durwin Nora, [facility]         Discharge Medications: Please see discharge summary for a list of discharge medications.  Relevant Imaging Results:  Relevant Lab Results:   Additional Information SS#: [id number]  Cranford Mon, [place]

## 2016-01-07 NOTE — Research (Signed)
Apixaban Validation Study (ClinicalTrials.gov Identifier: [id number]) RESEARCH SUBJECT. Purpose: Obtain fresh samples that will be used to assess the performances of STA-Apixaban Calibrator and STA-Apixaban Control in combination with the STA-Liquid Anti-Xa to determine the quantity of apixaban in plasma samples by measurement of its direct anti-Xa activity. Apixaban Validation Study is sponsored by [employer].The fresh samples will collected by completing a one time blood draw on approved patients.   Inclusion Criteria: Must meet AT LEAST one of the following:  weight </= 60kg or >/= 75 years or Hct <39% for male or < 36% for male or renal impairment or co-medication with ASA/NSAIDs, or co-medication with anti-platelet agents.  Apixaban Validation Study Informed Consent   Subject Name: Mike Davis  This patient, Mike Davis, has been consented to the above clinical trial according to FDA regulations, GCP guidelines and [employer]'s SOPs. The informed consent form and study design have been explained to this patient by this study coordinator at 0900 on 01/07/2016. The patient demonstrated comprehension of this clinical trial and study requirements/expectations. No study procedures have been initiated before consenting of this patient. The patient was given sufficient time for reading the consent form. All risks, benefits and options have been thoroughly discussed and all questions were answered per the patient's satisfaction. This patient was not coerced in any way to participate in this clinical trial. This patient has voluntarily signed consent version 1.0 at 10:02 on 01/07/2016. A copy of the signed consent form was given to the patient and a copy was placed in the subject's medical record. Subject was thanked for their participation in research and contribution to science.  Dennison Mascot, RN Clinical Research Nurse  [place], [facility] Office: [phone]

## 2016-01-07 NOTE — Evaluation (Signed)
Physical Therapy Evaluation Patient Details Name: Mike Davis MRN: [id number] DOB: 1940-10-29 Today's Date: 01/07/2016   History of Present Illness  75 y.o. male with a PMH of hypertension who was admitted by the critical care team on 01/04/16 for treatment of pulmonary embolism with right heart strain status post recent L3-4 laminectomy  Clinical Impression  Patient demonstrates deficits in functional mobility as indicated below. Will need continued skilled PT to address deficits and maximize function. Will see as indicated and progress as tolerated.    Follow Up Recommendations SNF;Supervision for mobility/OOB (Return to [facility] for resumption of therapies)    Equipment Recommendations  None recommended by PT    Recommendations for Other Services       Precautions / Restrictions Precautions Precautions: Back Precaution Booklet Issued: No Required Braces or Orthoses: Spinal Brace Spinal Brace: Lumbar corset Restrictions Weight Bearing Restrictions: No      Mobility  Bed Mobility               General bed mobility comments: received in chair  Transfers Overall transfer level: Needs assistance Equipment used: Rolling walker (2 wheeled) Transfers: Sit to/from Stand Sit to Stand: Min assist         General transfer comment: VCs for hand placement, min assist for stability and elevation to upright  Ambulation/Gait Ambulation/Gait assistance: Min guard Ambulation Distance (Feet): 90 Feet (x5 with extended rest breaks required) Assistive device: Rolling walker (2 wheeled) Gait Pattern/deviations: Step-through pattern;Decreased stride length;Trunk flexed Gait velocity: decreased Gait velocity interpretation: Below normal speed for age/gender General Gait Details: heavy reliance on RW for UE support and stability. Ambulated on room air but required several extended standing rest breaks to assess saturations and HR. Saturations stable >90% throughout, HR elevated  to upper 120s with activity.   Stairs Stairs:  (not able to perform at this time)          Wheelchair Mobility    Modified Rankin (Stroke Patients Only)       Balance Overall balance assessment: History of Falls                                           Pertinent Vitals/Pain Pain Assessment: No/denies pain    Home Living Family/patient expects to be discharged to:: Skilled nursing facility Living Arrangements: Spouse/significant other Available Help at Discharge: Family Type of Home: House Home Access: Stairs to enter Entrance Stairs-Rails: None Entrance Stairs-Number of Steps: 2          Prior Function Level of Independence: Independent         Comments: independent prior to onset of back pain leading to recent surgery     Hand Dominance   Dominant Hand: Right    Extremity/Trunk Assessment   Upper Extremity Assessment: Overall WFL for tasks assessed           Lower Extremity Assessment: Overall WFL for tasks assessed         Communication   Communication: No difficulties  Cognition Arousal/Alertness: Awake/alert Behavior During Therapy: WFL for tasks assessed/performed Overall Cognitive Status: Within Functional Limits for tasks assessed                      General Comments      Exercises        Assessment/Plan    PT Assessment Patient needs continued PT services  PT  Diagnosis Difficulty walking;Abnormality of gait   PT Problem List Decreased strength;Decreased activity tolerance;Decreased balance;Decreased mobility;Decreased safety awareness;Decreased knowledge of precautions  PT Treatment Interventions DME instruction;Gait training;Stair training;Functional mobility training;Therapeutic activities;Therapeutic exercise;Balance training;Patient/family education   PT Goals (Current goals can be found in the Care Plan section) Acute Rehab PT Goals Patient Stated Goal: to get back to moving better PT Goal  Formulation: With patient Time For Goal Achievement: 01/21/16 Potential to Achieve Goals: Good    Frequency Min 3X/week   Barriers to discharge Inaccessible home environment      Co-evaluation               End of Session Equipment Utilized During Treatment: Gait belt;Back brace;Oxygen Activity Tolerance: Patient tolerated treatment well;Patient limited by fatigue Patient left: in chair;with call bell/phone within reach Nurse Communication: Mobility status         Time: [id number] PT Time Calculation (min) (ACUTE ONLY): 22 min   Charges:   PT Evaluation $PT Eval Moderate Complexity: 1 Procedure     PT G CodesDuncan Dull 01-19-2016, 4:41 PM Alben Deeds, [place] DPT  [phone]

## 2016-01-07 NOTE — Care Management Important Message (Signed)
Important Message  Patient Details  Name: Mike Davis MRN: [id number] Date of Birth: 1941/06/12   Medicare Important Message Given:  Yes    Nathen May 01/07/2016, 10:51 AM

## 2016-01-07 NOTE — Progress Notes (Signed)
VASCULAR LAB PRELIMINARY  PRELIMINARY  PRELIMINARY  PRELIMINARY  Bilateral lower extremity venous duplex completed.     Right:  DVT noted in the posterior tibial veins and peroneal veins.   Evidence of superficial thrombosis in the small saphenous vein.   Left no evidence of DVT. No evidence of superficial thrombosis.  Bilaterally- No Baker's cyst.  Results to patients nurses.  Dariona Postma, RVT, RDMS 01/07/2016, 11:05 AM

## 2016-01-07 NOTE — Discharge Instructions (Signed)
Information on my medicine - ELIQUIS (apixaban)  This medication education was reviewed with me or my healthcare representative as part of my discharge preparation.  The pharmacist that spoke with me during my hospital stay was:  Tomasita Morrow, [facility]  Why was Eliquis prescribed for you? Eliquis was prescribed to treat blood clots that may have been found in the veins of your legs (deep vein thrombosis) or in your lungs (pulmonary embolism) and to reduce the risk of them occurring again.  What do You need to know about Eliquis ? The starting dose is 10 mg (two 5 mg tablets) taken TWICE daily for the FIRST SEVEN (7) DAYS, then on (enter date)    the dose is reduced to ONE 5 mg tablet taken TWICE daily.  Eliquis may be taken with or without food.   Try to take the dose about the same time in the morning and in the evening. If you have difficulty swallowing the tablet whole please discuss with your pharmacist how to take the medication safely.  Take Eliquis exactly as prescribed and DO NOT stop taking Eliquis without talking to the doctor who prescribed the medication.  Stopping may increase your risk of developing a new blood clot.  Refill your prescription before you run out.  After discharge, you should have regular check-up appointments with your healthcare provider that is prescribing your Eliquis.    What do you do if you miss a dose? If a dose of ELIQUIS is not taken at the scheduled time, take it as soon as possible on the same day and twice-daily administration should be resumed. The dose should not be doubled to make up for a missed dose.  Important Safety Information A possible side effect of Eliquis is bleeding. You should call your healthcare provider right away if you experience any of the following: ? Bleeding from an injury or your nose that does not stop. ? Unusual colored urine (red or dark brown) or unusual colored stools (red or black). ? Unusual bruising for  unknown reasons. ? A serious fall or if you hit your head (even if there is no bleeding).  Some medicines may interact with Eliquis and might increase your risk of bleeding or clotting while on Eliquis. To help avoid this, consult your healthcare provider or pharmacist prior to using any new prescription or non-prescription medications, including herbals, vitamins, non-steroidal anti-inflammatory drugs (NSAIDs) and supplements.  This website has more information on Eliquis (apixaban): http://www.eliquis.com/eliquis/home

## 2016-01-07 NOTE — Clinical Social Work Note (Signed)
Clinical Social Work Assessment  Patient Details  Name: Mike Davis MRN: [id number] Date of Birth: [birth year]  Date of referral:  01/07/16               Reason for consult:  Facility Placement                Permission sought to share information with:  Family Supports Permission granted to share information::  Yes, Verbal Permission Granted  Name::     Medical sales representative::  [facility]  Relationship::  wife  Contact Information:     Housing/Transportation Living arrangements for the past 2 months:  [place], [place] of Information:  Patient Patient Interpreter Needed:  None Criminal Activity/Legal Involvement Pertinent to Current Situation/Hospitalization:  No - Comment as needed Significant Relationships:  Adult Children, Spouse Lives with:  Spouse Do you feel safe going back to the place where you live?  Yes Need for family participation in patient care:  No (Coment)  Care giving concerns:  None- pt was at rehab after back surgery and feels comfortable returning there to finish rehab.   Social Worker assessment / plan:  CSW spoke with pt and pt son-in-law at bedside concerning plan for time of DC.  Pt reports that he was progressing well at SNF and felt almost ready to return home when he had a set back from developing clots.  Employment status:  Retired Forensic scientist:  Medicare PT Recommendations:  [place] / Referral to community resources:  [place]  Patient/Family's Response to care:  Pt is agreeable to return to SNF to regain strength prior to return home.  Patient/Family's Understanding of and Emotional Response to Diagnosis, Current Treatment, and Prognosis:  Pt has good understanding of his medical condition and could explain in detail what had happened to him- he does not express any concerns with care at this time and stated that he has had exceptional care at [facility].  Emotional Assessment Appearance:  Appears stated age Attitude/Demeanor/Rapport:    Affect (typically observed):  Appropriate Orientation:  Oriented to Self, Oriented to Place, Oriented to  Time, Oriented to Situation Alcohol / Substance use:  Not Applicable Psych involvement (Current and /or in the community):  No (Comment)  Discharge Needs  Concerns to be addressed:    Readmission within the last 30 days:  No Current discharge risk:  Physical Impairment Barriers to Discharge:  Continued Medical Work up   [employer], LCSW 01/07/2016, 3:08 PM

## 2016-01-07 NOTE — Progress Notes (Signed)
Patient working with PT at this time will transfer when done with PT

## 2016-01-07 NOTE — Progress Notes (Signed)
Called report to receiving unit. [id number]

## 2016-01-08 MED ORDER — APIXABAN 5 MG PO TABS: 5.0000 mg | ORAL_TABLET | Freq: Two times a day (BID) | ORAL | Status: DC

## 2016-01-08 MED ORDER — APIXABAN 5 MG PO TABS
10.0000 mg | ORAL_TABLET | Freq: Two times a day (BID) | ORAL | Status: DC
Start: 2016-01-08 — End: 2016-09-16

## 2016-01-08 NOTE — Progress Notes (Addendum)
Patient in stable condition, this RN went over discharge instructions with patient ,patient verbalised understanding iv removed, tele dc ccmd notified, report called to the nurse at [place] center, patient taken off the unit on a wheelchair

## 2016-01-08 NOTE — Discharge Summary (Signed)
Physician Discharge Summary  Mike Davis [id number] DOB: [birth year] DOA: 01/04/2016  PCP: Deloria Lair, MD  Admit date: 01/04/2016 Discharge date: 01/08/2016   Recommendations for Outpatient Follow-Up:   1. The patient should F/U with his PCP in 2 weeks for hospital follow up. 2. Resume PT at rehab.   Discharge Diagnosis:   Principal Problem:    Pulmonary embolism (HCC) Active Problems:    Essential hypertension    Hypokalemia    Hyperglycemia    Demand ischemia (HCC)    Elevated troponin    Right leg DVT ([place])    Prediabetes   Discharge disposition:  SNF.    Discharge Condition: Improved.  Diet recommendation: Low sodium, heart healthy.   History of Present Illness:   Mike Davis is an 75 y.o. male with a PMH of hypertension who was admitted by the critical care team on 01/04/16 for treatment of pulmonary embolism with right heart strain status post recent L3-4 laminectomy.  Hospital Course by Problem:   Principal Problem:  Pulmonary embolism ([place]) with right heart strain and demand ischemia/elevated troponin /Right lower extremity DVT Patient initially managed on a heparin drip. 2-D echo done 01/05/16, EF 60-65 percent, no regional wall motion abnormalities. There was moderately increased pulmonary artery systolic pressure at 56 mm Hg. Lower extremity Dopplers + RLE DVT. Long discussion held with patient regarding blood thinning options. Eliquis Started 01/06/16. Will need 3-6 months of therapy.  Active Problems:  Essential hypertension S/P IV Lasix, 20 mg, every 8 hours x 3. Home medications include lisinopril and metoprolol which are on hold at present. Blood pressure stable. Resume at D/C.   Hypokalemia Resolved with supplementation.   Hyperglycemia/prediabetes Hemoglobin A1c is 5.9%, consistent with prediabetes. Dietitian to counsel the patient on appropriate diet changes to address.   Medical Consultants:    None.   Discharge  Exam:   Filed Vitals:   01/07/16 2118 01/08/16 0553  BP: 134/60 111/55  Pulse: 78 72  Temp: 97.6 F (36.4 C) 97.4 F (36.3 C)  Resp: 20 20   Filed Vitals:   01/07/16 1512 01/07/16 1543 01/07/16 2118 01/08/16 0553  BP: 128/66 120/56 134/60 111/55  Pulse: 89 92 78 72  Temp: 97.6 F (36.4 C) 97.5 F (36.4 C) 97.6 F (36.4 C) 97.4 F (36.3 C)  TempSrc: Oral Oral Oral Oral  Resp: 28 20 20 20   Height:      Weight:    124.875 kg (275 lb 4.8 oz)  SpO2: 99% 98% 99% 99%    Gen:  NAD Cardiovascular:  RRR, No M/R/G Respiratory: Lungs CTAB Gastrointestinal: Abdomen soft, NT/ND with normal active bowel sounds. Extremities: No C/E/C   The results of significant diagnostics from this hospitalization (including imaging, microbiology, ancillary and laboratory) are listed below for reference.     Procedures and Diagnostic Studies:   No results found.   Labs:   Basic Metabolic Panel:  Recent Labs Lab 01/05/16 0245 01/06/16 0350 01/06/16 2358  NA 137 138 136  K 3.1* 3.5 3.7  CL 104 105 107  CO2 24 22 21*  GLUCOSE 142* 131* 106*  BUN 10 14 15   CREATININE 0.98 0.91 0.89  CALCIUM 7.7* 7.8* 7.7*  MG  --  2.0  --   PHOS  --  2.5  --    GFR Estimated Creatinine Clearance: 100.8 mL/min (by C-G formula based on Cr of 0.89). Liver Function Tests:  Recent Labs Lab 01/05/16 0245  AST 87*  ALT 84*  ALKPHOS  87  BILITOT 0.8  PROT 5.5*  ALBUMIN 2.3*   CBC:  Recent Labs Lab 01/05/16 0245 01/06/16 0350 01/06/16 2350  WBC 10.6* 8.6 8.3  HGB 11.9* 11.5* 11.3*  HCT 36.5* 35.1* 34.6*  MCV 84.1 84.2 86.3  PLT 204 258 262   Cardiac Enzymes:  Recent Labs Lab 01/04/16 0030 01/05/16 0600  TROPONINI 0.07* 0.07*   Hgb A1c  Recent Labs  01/06/16 0820  HGBA1C 5.9*   Microbiology Recent Results (from the past 240 hour(s))  MRSA PCR Screening     Status: None   Collection Time: 01/04/16  9:43 PM  Result Value Ref Range Status   MRSA by PCR NEGATIVE NEGATIVE  Final    Comment:        The GeneXpert MRSA Assay (FDA approved for NASAL specimens only), is one component of a comprehensive MRSA colonization surveillance program. It is not intended to diagnose MRSA infection nor to guide or monitor treatment for MRSA infections.      Discharge Instructions:   Discharge Instructions    Call MD for:  difficulty breathing, headache or visual disturbances    Complete by:  As directed      Call MD for:  extreme fatigue    Complete by:  As directed      Call MD for:  redness, tenderness, or signs of infection (pain, swelling, redness, odor or green/yellow discharge around incision site)    Complete by:  As directed      Call MD for:  severe uncontrolled pain    Complete by:  As directed      Diet - low sodium heart healthy    Complete by:  As directed      Increase activity slowly    Complete by:  As directed             Medication List    STOP taking these medications        oxyCODONE-acetaminophen 5-325 MG tablet  Commonly known as:  PERCOCET/ROXICET      TAKE these medications        acetaminophen 325 MG tablet  Commonly known as:  TYLENOL  Take 650 mg by mouth every 6 (six) hours as needed (pain).     alum & mag hydroxide-simeth 200-200-20 MG/5ML suspension  Commonly known as:  MAALOX/MYLANTA  Take 30 mLs by mouth at bedtime as needed for indigestion or heartburn.     apixaban 5 MG Tabs tablet  Commonly known as:  ELIQUIS  Take 2 tablets (10 mg total) by mouth 2 (two) times daily.     apixaban 5 MG Tabs tablet  Commonly known as:  ELIQUIS  Take 1 tablet (5 mg total) by mouth 2 (two) times daily.  Start taking on:  01/14/2016     ascorbic acid 500 MG tablet  Commonly known as:  VITAMIN C  Take 500 mg by mouth daily.     calcium-vitamin D 250-125 MG-UNIT tablet  Commonly known as:  OSCAL WITH D  Take 1 tablet by mouth 2 (two) times daily.     cholecalciferol 1000 units tablet  Commonly known as:  VITAMIN D  Take  1,000 Units by mouth daily.     docusate sodium 100 MG capsule  Commonly known as:  COLACE  Take 100 mg by mouth 2 (two) times daily.     fexofenadine 180 MG tablet  Commonly known as:  ALLEGRA  Take 180 mg by mouth daily as needed for allergies or  rhinitis.     folic acid A999333 MCG tablet  Commonly known as:  FOLVITE  Take 400 mcg by mouth daily.     gabapentin 300 MG capsule  Commonly known as:  NEURONTIN  Take 300 mg by mouth 2 (two) times daily.     ibuprofen 200 MG tablet  Commonly known as:  ADVIL,MOTRIN  Take 600 mg by mouth every 6 (six) hours as needed (for pain with yardwork).     lisinopril 10 MG tablet  Commonly known as:  PRINIVIL,ZESTRIL  Take 10 mg by mouth daily.     magnesium hydroxide 400 MG/5ML suspension  Commonly known as:  MILK OF MAGNESIA  Take 30 mLs by mouth at bedtime as needed for mild constipation.     metoprolol succinate 50 MG 24 hr tablet  Commonly known as:  TOPROL-XL  Take 50 mg by mouth daily. Take with or immediately following a meal.     multivitamin with minerals tablet  Take 1 tablet by mouth daily.     promethazine 12.5 MG tablet  Commonly known as:  PHENERGAN  Take 12.5 mg by mouth every 8 (eight) hours as needed for nausea or vomiting.     vitamin B-12 500 MCG tablet  Commonly known as:  CYANOCOBALAMIN  Take 500 mcg by mouth daily.     zinc gluconate 50 MG tablet  Take 50 mg by mouth daily.           Follow-up Information    Follow up with TAPPER,DAVID B, MD. Schedule an appointment as soon as possible for a visit in 2 weeks.   Specialty:  Family Medicine   Why:  Hospital follow up.   Contact information:   [place] [address] [phone]        Time coordinating discharge: 35 minutes.  Signed:  RAMA,CHRISTINA  Pager [phone] [facility] 01/08/2016, 9:44 AM

## 2016-01-08 NOTE — Care Management Note (Signed)
Case Management Note Marvetta Gibbons RN, BSN Unit 2W-Case Manager [phone]  Patient Details  Name: Mike Davis MRN: [id number] Date of Birth: [birth year]  Subjective/Objective:        Pt had recent back surgery - admitted with PE/DVT            Action/Plan: PTA pt was at rehab following back surgery- plan to return to rehab when medically stable- CSW following for placement needs- return to [facility]  Expected Discharge Date:  01/08/16               Expected Discharge Plan:  Skilled Nursing Facility  In-House Referral:  Clinical Social Work  Discharge planning Services  CM Consult  Post Acute Care Choice:    Choice offered to:     DME Arranged:    DME Agency:     HH Arranged:    [place] Agency:     Status of Service:  Completed, signed off  Medicare Important Message Given:  Yes Date Medicare IM Given:    Medicare IM give by:    Date Additional Medicare IM Given:    Additional Medicare Important Message give by:     If discussed at [place] of Stay Meetings, dates discussed:    Discharge Disposition:  Skilled facility   Additional Comments:  Dawayne Patricia, RN 01/08/2016, 11:44 AM

## 2016-01-08 NOTE — Clinical Social Work Note (Signed)
CSW met with patient. Patient reported he would return to [facility] to continue short term rehab. Patient reported he would like for his daughter to provide transportation. CSW called patient's daughter per patient's request.   She reported she will provide patient with transportation to [facility]. RN made aware patient's daughter is to provide transport.  CSW spoke with Admission coordinator Mardene Celeste with [facility]. She reported patient can return today.    [employer], LCSW [phone]

## 2016-09-16 ENCOUNTER — Encounter: Payer: Self-pay | Admitting: Neurology

## 2016-09-16 ENCOUNTER — Ambulatory Visit (INDEPENDENT_AMBULATORY_CARE_PROVIDER_SITE_OTHER): Payer: Medicare Other | Admitting: Neurology

## 2016-09-16 VITALS — BP 134/80 | HR 64 | Resp 16 | Ht 73.0 in | Wt 270.0 lb

## 2016-09-16 DIAGNOSIS — G629 Polyneuropathy, unspecified: Secondary | ICD-10-CM | POA: Diagnosis not present

## 2016-09-16 DIAGNOSIS — R208 Other disturbances of skin sensation: Secondary | ICD-10-CM | POA: Diagnosis not present

## 2016-09-16 DIAGNOSIS — R600 Localized edema: Secondary | ICD-10-CM

## 2016-09-16 DIAGNOSIS — R269 Unspecified abnormalities of gait and mobility: Secondary | ICD-10-CM

## 2016-09-16 MED ORDER — LAMOTRIGINE 100 MG PO TABS: 100.0000 mg | ORAL_TABLET | Freq: Two times a day (BID) | ORAL | 5 refills | Status: DC

## 2016-09-16 NOTE — Patient Instructions (Signed)
The pharmacy has the prescription for lamotrigine 100 mg tablets. For 5 days, just take one half pill a day. For the next 5 days, take one half pill twice a day. For the next 5 days, take one half pill 3 times a day Then start taking one pill twice a day from this point on.    In the future, we may increase the dose further.  If you get a rash, need to stop the medication and not take it again. 

## 2016-09-16 NOTE — Progress Notes (Signed)
[facility]  PATIENT: Mike Davis DOB: 1941/01/05  REFERRING DOCTOR OR PCP:  Matthias Hughs SOURCE: patient, notes from PCP, labs in EMR, 12/2015 hospital notes  _________________________________   HISTORICAL  CHIEF COMPLAINT:  Chief Complaint  Patient presents with  . Pain    Sts. he has had burning sensation, edema bilat feeet, left worse than right for about 3 yrs.  Sx. worsened after lumbar surgery in October 2017.  Also had lumbar surgery June 2017.  After the June surgery,  he developed blood clots in left leg and left lung, was on Eliquis until last week.   Gabapentin helped at first, not as much now.  Sts. exercising on recumbent bike helps, sitting makes sx. worse./fim  . Foot Swelling    HISTORY OF PRESENT ILLNESS:  I had the pleasure seeing your patient, Mike Davis, at [facility] for neurologic consultation regarding his burning sensation in both feet.  He is a 76 yo man who has had dysesthetic foot pain for the past 3 years that has slowly worsened in intensity.    Pain became much worse after lumbar surgery at L3L4 June 2017.   Surgery was complicated by post-operative DVT and PE.   He had a second surgery April 30, 2016. Edema in his legs also seemed to worsen after the second operation.    Currently, pain is in both feet up to the ankle.   Pain is worse when he sits still and is better with activity.    Riding a recumbent bike helps for a few hours.    Gabapentin 600 mg po bid has helped some initally but less more recently.    Eliquis for DVT/PE was discontinued last week.     He denies foot weakness.    He has LBP that stays in the LB without radiation and is distinct from the foot pain in quality.      He does not have diabetes mellitus.   He has never had an EMG/NCV study.    He denies difficulty sleeping.  However, he needs to sleep in a chair with his feet up to get comfortable due to back pain if he lays flat.   He  wakes up with minimal edema and notes more in the afternoons after sitting a while (he is more active in the mornings, [facility] and edema is mild in the mornings)    REVIEW OF SYSTEMS: Constitutional: No fevers, chills, sweats, or change in appetite Eyes: No visual changes, double vision, eye pain Ear, nose and throat: No hearing loss, ear pain, nasal congestion, sore throat Cardiovascular: No chest pain, palpitations.  Respiratory: No shortness of breath at rest or with exertion.   No wheezes.   Recent PE GastrointestinaI: No nausea, vomiting, diarrhea, abdominal pain, fecal incontinence Genitourinary: No dysuria, urinary retention or frequency.  No nocturia. Musculoskeletal: No neck pain, back pain Integumentary/Ext: No rash, pruritus, skin lesions.  Has edema Neurological: as above Psychiatric: No depression at this time.  No anxiety Endocrine: No palpitations, diaphoresis, change in appetite, change in weigh or increased thirst Hematologic/Lymphatic: No anemia, purpura, petechiae. Allergic/Immunologic: No itchy/runny eyes, nasal congestion, recent allergic reactions, rashes  ALLERGIES: No Known Allergies  HOME MEDICATIONS:  Current Outpatient Prescriptions:  .  acetaminophen (TYLENOL) 325 MG tablet, Take 650 mg by mouth every 6 (six) hours as needed (pain)., Disp: , Rfl:  .  alum & mag hydroxide-simeth (MAALOX/MYLANTA) 027-741-28 MG/5ML suspension, Take 30 mLs by mouth at  bedtime as needed for indigestion or heartburn., Disp: , Rfl:  .  ascorbic acid (VITAMIN C) 500 MG tablet, Take 500 mg by mouth daily., Disp: , Rfl:  .  calcium-vitamin D (OSCAL WITH D) 250-125 MG-UNIT tablet, Take 1 tablet by mouth 2 (two) times daily., Disp: , Rfl:  .  cholecalciferol (VITAMIN D) 1000 units tablet, Take 1,000 Units by mouth daily., Disp: , Rfl:  .  docusate sodium (COLACE) 100 MG capsule, Take 100 mg by mouth 2 (two) times daily., Disp: , Rfl:  .  fexofenadine (ALLEGRA) 180 MG tablet,  Take 180 mg by mouth daily as needed for allergies or rhinitis., Disp: , Rfl:  .  folic acid (FOLVITE) 701 MCG tablet, Take 400 mcg by mouth daily., Disp: , Rfl:  .  furosemide (LASIX) 20 MG tablet, TAKE 1 TABLET EVERY MORNING FOR FLUID FOR 30 DAYS, Disp: , Rfl: 3 .  gabapentin (NEURONTIN) 300 MG capsule, Take 300 mg by mouth 2 (two) times daily., Disp: , Rfl:  .  ibuprofen (ADVIL,MOTRIN) 200 MG tablet, Take 600 mg by mouth every 6 (six) hours as needed (for pain with yardwork)., Disp: , Rfl:  .  losartan (COZAAR) 25 MG tablet, TAKE 1 TABLET BY MOUTH EVERY MORNING FOR BLOOD PRESSURE, Disp: , Rfl: 3 .  magnesium hydroxide (MILK OF MAGNESIA) 400 MG/5ML suspension, Take 30 mLs by mouth at bedtime as needed for mild constipation., Disp: , Rfl:  .  metoprolol succinate (TOPROL-XL) 50 MG 24 hr tablet, Take 50 mg by mouth daily. Take with or immediately following a meal., Disp: , Rfl:  .  Multiple Vitamins-Minerals (MULTIVITAMIN WITH MINERALS) tablet, Take 1 tablet by mouth daily., Disp: , Rfl:  .  promethazine (PHENERGAN) 12.5 MG tablet, Take 12.5 mg by mouth every 8 (eight) hours as needed for nausea or vomiting., Disp: , Rfl:  .  vitamin B-12 (CYANOCOBALAMIN) 500 MCG tablet, Take 500 mcg by mouth daily., Disp: , Rfl:  .  zinc gluconate 50 MG tablet, Take 50 mg by mouth daily., Disp: , Rfl:  .  apixaban (ELIQUIS) 5 MG TABS tablet, Take 1 tablet (5 mg total) by mouth 2 (two) times daily. (Patient not taking: Reported on 09/16/2016), Disp: 60 tablet, Rfl:   PAST MEDICAL HISTORY: Past Medical History:  Diagnosis Date  . CHF (congestive heart failure) ([place])   . DVT (deep venous thrombosis) ([place])   . 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 Hypertension   . Macular pucker, right   . Pulmonary emboli ([place])   . Shortness of breath dyspnea     PAST SURGICAL HISTORY: Past Surgical History:  Procedure Laterality Date  . CATARACT EXTRACTION Right   . LUMBAR SPINE SURGERY      FAMILY HISTORY: Family  History  Problem Relation Age of Onset  . Brain cancer Mother   . Stroke Father   . Leukemia Brother     SOCIAL HISTORY:  Social History   Social History  . Marital status: Single    Spouse name: N/A  . Number of children: N/A  . Years of education: N/A   Occupational History  . Not on file.   Social History Main Topics  . Smoking status: Never Smoker  . Smokeless tobacco: Never Used  . Alcohol use No  . Drug use: No  . Sexual activity: No   Other Topics Concern  . Not on file   Social History Narrative  . No narrative on file  PHYSICAL EXAM  Vitals:   09/16/16 1019  BP: 134/80  Pulse: 64  Resp: 16  Weight: 270 lb (122.5 kg)  Height: _0  (1.854 m)    Body mass index is 35.62 kg/m.   General: The patient is well-developed and well-nourished and in no acute distress  Eyes:  Funduscopic exam shows normal optic discs and retinal vessels.  Neck: The neck is supple, no carotid bruits are noted.  The neck is nontender.  Cardiovascular: The heart has a regular rate and rhythm with a normal S1 and S2. There were no murmurs, gallops or rubs.   Skin: Extremities are without significant edema.  Musculoskeletal:  Back is nontender  Neurologic Exam  Mental status: The patient is alert and oriented x 3 at the time of the examination. The patient has apparent normal recent and remote memory, with an apparently normal attention span and concentration ability.   Speech is normal.  Cranial nerves: Extraocular movements are full. Pupils are equal, round, and reactive to light and accomodation.  Visual fields are full.  Facial symmetry is present. There is good facial sensation to soft touch bilaterally.Facial strength is normal.  Trapezius and sternocleidomastoid strength is normal. No dysarthria is noted.  The tongue is midline, and the patient has symmetric elevation of the soft palate. No obvious hearing deficits are noted.  Motor:  Muscle bulk is normal.    Tone is normal. Strength is  5 / 5 in all 4 extremities except 4+/5 EHL (foot)  Sensory: Sensory testing is intact to pinprick, soft touch and vibration sensation in the arms.   Touch and temperature sensation are mildly reduced in his feet. Vibration sensation is absent at the toes bilaterally and about 80-90% reduced at the ankles bilaterally.  Coordination: Cerebellar testing reveals good finger-nose-finger and heel-to-shin bilaterally.  Gait and station: Station is normal.   Gait is mildly wide based and he looks down as he walks. Tandem gait is very wide . Romberg is positive..   Reflexes: Deep tendon reflexes are symmetric and normal bilaterally.   Plantar responses are flexor.    DIAGNOSTIC DATA (LABS, IMAGING, TESTING) - I reviewed patient records, labs, notes, testing and imaging myself where available.  Lab Results  Component Value Date   WBC 8.3 01/06/2016   HGB 11.3 (L) 01/06/2016   HCT 34.6 (L) 01/06/2016   MCV 86.3 01/06/2016   PLT 262 01/06/2016      Component Value Date/Time   NA 136 01/06/2016 2358   K 3.7 01/06/2016 2358   CL 107 01/06/2016 2358   CO2 21 (L) 01/06/2016 2358   GLUCOSE 106 (H) 01/06/2016 2358   BUN 15 01/06/2016 2358   CREATININE 0.89 01/06/2016 2358   CALCIUM 7.7 (L) 01/06/2016 2358   PROT 5.5 (L) 01/05/2016 0245   ALBUMIN 2.3 (L) 01/05/2016 0245   AST 87 (H) 01/05/2016 0245   ALT 84 (H) 01/05/2016 0245   ALKPHOS 87 01/05/2016 0245   BILITOT 0.8 01/05/2016 0245   GFRNONAA >60 01/06/2016 2358   GFRAA >60 01/06/2016 2358   No results found for: CHOL, HDL, LDLCALC, LDLDIRECT, TRIG, CHOLHDL Lab Results  Component Value Date   HGBA1C 5.9 (H) 01/06/2016       ASSESSMENT AND PLAN  Polyneuropathy ([place]) - Plan: NCV with EMG(electromyography), Sedimentation rate, Vitamin B12, Multiple Myeloma Panel (SPEP&IFE w/QIG), Cryoglobulin, Hemoglobin A1c  Dysesthesia - Plan: NCV with EMG(electromyography), Sedimentation rate, Vitamin B12, Multiple  Myeloma Panel (SPEP&IFE w/QIG), Cryoglobulin, Hemoglobin A1c  Gait  disturbance  Pedal edema   1.    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   To better characterize, we will check a NCV/EMG study. 2.    To rule out some treatable causes of neuropathy, check ESR, B12, SPEP/IEF, cryoglobulin and hemoglobin A1c. 3.     Lamotrigine, titrate up to 100 mg by mouth twice a day for the dysesthetic pain. For now, continue gabapentin but consider reducing the dose if lamotrigine helps more. 4.      He will follow up to see me for the NCV/EMG study. Further follow-up can be arranged after that. He should call sooner if he has significant new or worsening neurologic symptoms.   Joya Willmott A. Felecia Shelling, MD, PhD 5/44/9201, 00:71 AM Certified in Neurology, Clinical Neurophysiology, Sleep Medicine, Pain Medicine and Neuroimaging  [facility] [address] [place], [place] [address] [phone]

## 2016-09-22 LAB — MULTIPLE MYELOMA PANEL, SERUM
ALBUMIN SERPL ELPH-MCNC: 3.6 g/dL (ref 2.9–4.4)
Albumin/Glob SerPl: 1.2 (ref 0.7–1.7)
Alpha 1: 0.2 g/dL (ref 0.0–0.4)
Alpha2 Glob SerPl Elph-Mcnc: 0.8 g/dL (ref 0.4–1.0)
B-GLOBULIN SERPL ELPH-MCNC: 1.2 g/dL (ref 0.7–1.3)
Gamma Glob SerPl Elph-Mcnc: 0.9 g/dL (ref 0.4–1.8)
Globulin, Total: 3.1 g/dL (ref 2.2–3.9)
IgA/Immunoglobulin A, Serum: 317 mg/dL (ref 61–437)
IgG (Immunoglobin G), Serum: 856 mg/dL (ref 700–1600)
IgM (Immunoglobulin M), Srm: 37 mg/dL (ref 15–143)
TOTAL PROTEIN: 6.7 g/dL (ref 6.0–8.5)

## 2016-09-22 LAB — CRYOGLOBULIN

## 2016-09-22 LAB — VITAMIN B12: Vitamin B-12: 845 pg/mL (ref 232–1245)

## 2016-09-22 LAB — SEDIMENTATION RATE: SED RATE: 11 mm/h (ref 0–30)

## 2016-09-22 LAB — HEMOGLOBIN A1C
ESTIMATED AVERAGE GLUCOSE: 114 mg/dL
Hgb A1c MFr Bld: 5.6 % (ref 4.8–5.6)

## 2016-09-24 ENCOUNTER — Telehealth: Payer: Self-pay | Admitting: *Deleted

## 2016-09-24 NOTE — Telephone Encounter (Signed)
-----   Message from Britt Bottom, MD sent at 09/23/2016  5:40 PM EST ----- Please let him know the bloodwork was normal.    I will see him when he comes in for EMG

## 2016-09-24 NOTE — Telephone Encounter (Signed)
I have spoken with pt. this morning and per RAS, advised labs done in our office are normal/fim

## 2016-11-19 ENCOUNTER — Ambulatory Visit (INDEPENDENT_AMBULATORY_CARE_PROVIDER_SITE_OTHER): Payer: Self-pay | Admitting: Neurology

## 2016-11-19 ENCOUNTER — Ambulatory Visit (INDEPENDENT_AMBULATORY_CARE_PROVIDER_SITE_OTHER): Payer: Medicare Other | Admitting: Neurology

## 2016-11-19 DIAGNOSIS — Z0289 Encounter for other administrative examinations: Secondary | ICD-10-CM

## 2016-11-19 DIAGNOSIS — R208 Other disturbances of skin sensation: Secondary | ICD-10-CM

## 2016-11-19 DIAGNOSIS — G629 Polyneuropathy, unspecified: Secondary | ICD-10-CM

## 2016-11-19 MED ORDER — OXCARBAZEPINE 300 MG PO TABS: 300.0000 mg | ORAL_TABLET | Freq: Two times a day (BID) | ORAL | 6 refills | Status: DC

## 2016-11-19 NOTE — Progress Notes (Signed)
Full Name: Mike Davis Gender: Male MRN #: [id number] Date of Birth: 1941/05/12    Visit Date: 11/19/2016 15:11 Age: 76 Years 34 Months Old Examining Physician: Arlice Colt, MD  Referring Physician: Felecia Shelling, MD    History: Mr. Vonbehren is a 76 year old man who has had a several year history of progressive numbness and painful dysesthesias in his legs.  Nerve conduction studies:  The right peroneal motor response was absent. The left peroneal motor response (recorded at EDB) had a markedly reduced amplitude with mildly delayed distal latency and mild to moderate reduced conduction velocity.  When recorded at the more proximal tibialis anterior muscle, the peroneal motor responses were essentially normal. The bilateral tibial motor responses were reduced in amplitude with moderately delayed distal latency and mildly reduced conduction velocities. The amplitude and distal latency was more prolonged on the left. The bilateral sural and superficial peroneal sensory responses were absent.   The tibial and right ulnar F-wave responses were mildly delayed.  The right radial and ulnar sensory responses had normal peak latencies and mildly reduced amplitudes  EMG: Needle EMG of the right iliopsoas, right vastus medialis and right gluteus medius muscles was normal. The right tibialis anterior and peroneus longus muscles showed mild chronic elevation but no acute denervation. There was mixed chronic and acute denervation in the gastrocnemius and the first dorsal interosseous (foot) muscles.  Conclusion: This NCV/EMG study shows the following: 1.  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 The existence of acute denervation in the more distal muscles implies an ongoing process. 2. There was no evidence of a significant superimposed radiculopathy   Richard A. Felecia Shelling, MD, PhD Certified in Neurology, Clinical Neurophysiology, Sleep Medicine, Pain Medicine and  Neuroimaging Director, [place] [facility] [address] [place], [place] [address] [phone]   Addendum:  He was unable to tolerate lamotrigine due to a rash. Trileptal will be prescribed.        [place]    Nerve / Sites Rec. Site Latency Ref. Amplitude Ref. Rel Amp Segments Distance Velocity Ref. Area    ms ms mV mV %  cm m/s m/s mVms  R Ulnar - ADM     Wrist ADM 3.3 ?3.3 10.7 ?6.0 100 Wrist - ADM 7   39.9     B.Elbow ADM 7.4  9.4  87.8 B.Elbow - Wrist 20 48 ?49 34.5     A.Elbow ADM 9.9  8.9  94.2 A.Elbow - B.Elbow 12 48 ?49 32.8         A.Elbow - Wrist      R Peroneal - EDB     Ankle EDB NR ?6.5 NR ?2.0 NR Ankle - EDB 9   NR     Fib head EDB NR  NR  NR Fib head - Ankle   ?44 NR         Pop fossa - Ankle      L Peroneal - EDB     Ankle EDB 7.2 ?6.5 0.3 ?2.0 100 Ankle - EDB 9   1.3     Fib head EDB 18.8  0.4  116 Fib head - Ankle 32 28 ?44 1.1     Pop fossa EDB 21.9  0.4  105 Pop fossa - Fib head 10 33 ?44 1.3         Pop fossa - Ankle      R Tibial -  AH     Ankle AH 8.9 ?5.8 3.4 ?4.0 100 Ankle - AH 9   8.4     Pop fossa AH 21.7  3.0  87.7 Pop fossa - Ankle 41 32 ?41 8.4  L Tibial - AH     Ankle AH 9.8 ?5.8 1.4 ?4.0 100 Ankle - AH 9   4.4     Pop fossa AH 21.6  1.6  110 Pop fossa - Ankle 40 34 ?41 5.5  R Peroneal - Tib Ant     Fib Head Tib Ant 3.8 ?6.7 4.9 ?3.0 100 Fib Head - Tib Ant 10   32.6     Pop fossa Tib Ant 5.8  4.8  97.2 Pop fossa - Fib Head 10 49 ?44 31.6  L Peroneal - Tib Ant     Fib Head Tib Ant 3.9 ?6.7 2.6 ?3.0 100 Fib Head - Tib Ant 10   27.6     Pop fossa Tib Ant 6.1  2.5  95.8 Pop fossa - Fib Head 10 45 ?44 25.3                   SNC    Nerve / Sites Rec. Site Peak Lat Ref.  Amp Ref. Segments Distance    ms ms V V  cm  R Radial - Anatomical snuff box (Forearm)     Forearm Wrist 2.76 ?2.90 11 ?15 Forearm - Wrist 10  R Sural - Ankle (Calf)     Calf Ankle NR ?4.40 NR ?6  Calf - Ankle 14  L Sural - Ankle (Calf)     Calf Ankle NR ?4.40 NR ?6 Calf - Ankle 14  R Superficial peroneal - Ankle     Lat leg Ankle NR ?4.40 NR ?6 Lat leg - Ankle 14  L Superficial peroneal - Ankle     Lat leg Ankle NR ?4.40 NR ?6 Lat leg - Ankle 14  R Ulnar - Orthodromic, (Dig V, Mid palm)     Dig V Wrist 3.02 ?3.10 5 ?5 Dig V - Wrist 58                 F  Wave    Nerve F Lat Ref.   ms ms  R Tibial - AH 66.2 ?56.0  L Tibial - AH 68.9 ?56.0  R Ulnar - ADM 34.1 ?32.0           EMG full       EMG Summary Table    Spontaneous MUAP Recruitment  Muscle IA Fib PSW Fasc Other Amp Dur. Poly Pattern  R. Vastus medialis Normal None None None _______ Normal Normal 1+ Normal  R. Tibialis anterior Normal None None None _______ Increased Increased 3+ Reduced  R. Peroneus longus Normal None None None _______ Increased Increased 1+ Reduced  R. Gastrocnemius (Medial head) Normal 1+ 1+ None _______ Increased Increased 3+ Reduced  R. Dorsal interossei (pedis) Normal 2+ 1+ None _______ Increased Increased 2+ Discrete  R. Gluteus medius Normal None None None _______ Normal Normal Normal Normal

## 2017-01-27 ENCOUNTER — Telehealth: Payer: Self-pay | Admitting: Neurology

## 2017-01-27 NOTE — Telephone Encounter (Signed)
I have spoken with pt. and per RAS, advised ok to stay off of Oxcarbazepine, and increase Gabapentin to 300mg  tid.  He verbalized understanding of same; sts. has a full bottle of Gabapentin and will let me know when he needs a refill/fim

## 2017-01-27 NOTE — Telephone Encounter (Signed)
Patient is returning your call.  

## 2017-01-27 NOTE — Telephone Encounter (Signed)
Starr and per RAS, will advise ok to stay off of Trileptal, increase Gabapentin to 300mg  tid/fim

## 2017-01-27 NOTE — Telephone Encounter (Signed)
I have spoken with pt. this morning.  He is taking Gabapentin for polyneuropathy.  Tried Lamictal, but had rash with it, so stopped.  Now on Trileptal since May 2018, and has new rash to arms with some skin peeling on his hands. He stopped the Trileptal on Sunday.  Will check with RAS and call him back/fim

## 2017-01-27 NOTE — Telephone Encounter (Signed)
Patient called office in reference to Oxcarbazepine (TRILEPTAL) 300 MG tablet.  Patient states he broke out in a rash on Saturday and skin is peeling on his hands.  Patient believes it could be the medication causing symptoms and would like to know if he needs to discontinue.  Please call

## 2017-05-14 ENCOUNTER — Other Ambulatory Visit: Payer: Self-pay | Admitting: Neurology

## 2017-05-26 ENCOUNTER — Encounter: Payer: Self-pay | Admitting: Neurology

## 2017-05-26 ENCOUNTER — Ambulatory Visit: Payer: Medicare Other | Admitting: Neurology

## 2017-05-26 VITALS — BP 146/72 | HR 70 | Resp 20 | Wt 276.5 lb

## 2017-05-26 DIAGNOSIS — R600 Localized edema: Secondary | ICD-10-CM

## 2017-05-26 DIAGNOSIS — R269 Unspecified abnormalities of gait and mobility: Secondary | ICD-10-CM | POA: Diagnosis not present

## 2017-05-26 DIAGNOSIS — G629 Polyneuropathy, unspecified: Secondary | ICD-10-CM | POA: Diagnosis not present

## 2017-05-26 DIAGNOSIS — R208 Other disturbances of skin sensation: Secondary | ICD-10-CM | POA: Diagnosis not present

## 2017-05-26 MED ORDER — GABAPENTIN 300 MG PO CAPS: 600.0000 mg | ORAL_CAPSULE | Freq: Two times a day (BID) | ORAL | 11 refills | Status: DC

## 2017-05-26 MED ORDER — OXCARBAZEPINE 300 MG PO TABS: 300.0000 mg | ORAL_TABLET | Freq: Two times a day (BID) | ORAL | 11 refills | Status: DC

## 2017-05-26 NOTE — Progress Notes (Signed)
  [facility]  PATIENT: Mike Davis DOB: 01/12/1941  REFERRING DOCTOR OR PCP:  David Tapper SOURCE: patient, notes from PCP, labs in EMR, 12/2015 hospital notes  _________________________________   HISTORICAL  CHIEF COMPLAINT:  Chief Complaint  Patient presents with  . Polyneuropathy    Sts. he restarted Trileptal (pcp told him rash was not connected with Trileptal).  Rash has not returned.  Also taking Gabapentin 600mg bid.  Sts. pain in feet is about 25% improved with these meds/fim    HISTORY OF PRESENT ILLNESS:  Update 05/26/2017:  Mike Davis is a 76 yo man with dysesthesias in his feet who was found on NCV/WMG May 2018 to have a moderately severe length dependent sensory and motor polyneuropathy.      His legs turn blue if they are not rasied up.   He is trying to stay more active ands uses an exercise bike 30 minutes or more most days.    He notes more swelling in his feet this year than last year.    Lasix has helped the edema some. He has not tried wearing compressive stockings. When he wears regular socks, he notes that the edema improves under the socks but then he has a bulge above the socks  Earlier this year labs were checked. HgbA1c, Cryoglobulins,  SPEP/IEF, B12 and ESR were fine.      He is on Trileptal 300 mg po bids and gabapentin 600 mg po bid.    he tolerates these medications well and feels that they have helped.  NCV/EMG Conclusion: This NCV/EMG study shows the following: 1.  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 The existence of acute denervation in the more distal muscles implies an ongoing process. 2. There was no evidence of a significant superimposed radiculopathy  __________________________________ From 09/16/2016:  He is a 75 yo man who has had dysesthetic foot pain for the past 3 years that has slowly worsened in intensity.    Pain became much worse after lumbar surgery at L3L4 June  2017.   Surgery was complicated by post-operative DVT and PE.   He had a second surgery April 30, 2016. Edema in his legs also seemed to worsen after the second operation.    Currently, pain is in both feet up to the ankle.   Pain is worse when he sits still and is better with activity.    Riding a recumbent bike helps for a few hours.    Gabapentin 600 mg po bid has helped some initally but less more recently.    Eliquis for DVT/PE was discontinued last week.     He denies foot weakness.    He has LBP that stays in the LB without radiation and is distinct from the foot pain in quality.      He does not have diabetes mellitus.   He has never had an EMG/NCV study.    He denies difficulty sleeping.  However, he needs to sleep in a chair with his feet up to get comfortable due to back pain if he lays flat.   He wakes up with minimal edema and notes more in the afternoons after sitting a while (he is more active in the mornings, [facility] and edema is mild in the mornings)    REVIEW OF SYSTEMS: Constitutional: No fevers, chills, sweats, or change in appetite Eyes: No visual changes, double vision, eye pain Ear, nose and throat: No hearing loss, ear pain, nasal   congestion, sore throat Cardiovascular: No chest pain, palpitations.  Respiratory: No shortness of breath at rest or with exertion.   No wheezes.   Recent PE GastrointestinaI: No nausea, vomiting, diarrhea, abdominal pain, fecal incontinence Genitourinary: No dysuria, urinary retention or frequency.  No nocturia. Musculoskeletal: No neck pain, back pain Integumentary/Ext: No rash, pruritus, skin lesions.  Has edema Neurological: as above Psychiatric: No depression at this time.  No anxiety Endocrine: No palpitations, diaphoresis, change in appetite, change in weigh or increased thirst Hematologic/Lymphatic: No anemia, purpura, petechiae. Allergic/Immunologic: No itchy/runny eyes, nasal congestion, recent allergic reactions,  rashes  ALLERGIES: Allergies  Allergen Reactions  . Lamictal [Lamotrigine] Rash    HOME MEDICATIONS:  Current Outpatient Medications:  .  acetaminophen (TYLENOL) 325 MG tablet, Take 650 mg by mouth every 6 (six) hours as needed (pain)., Disp: , Rfl:  .  ascorbic acid (VITAMIN C) 500 MG tablet, Take 500 mg by mouth daily., Disp: , Rfl:  .  calcium-vitamin D (OSCAL WITH D) 250-125 MG-UNIT tablet, Take 1 tablet by mouth 2 (two) times daily., Disp: , Rfl:  .  cholecalciferol (VITAMIN D) 1000 units tablet, Take 1,000 Units by mouth daily., Disp: , Rfl:  .  folic acid (FOLVITE) 622 MCG tablet, Take 400 mcg by mouth daily., Disp: , Rfl:  .  furosemide (LASIX) 20 MG tablet, TAKE 1 TABLET EVERY MORNING FOR FLUID FOR 30 DAYS, Disp: , Rfl: 3 .  gabapentin (NEURONTIN) 300 MG capsule, Take 2 capsules (600 mg total) 2 (two) times daily by mouth., Disp: 120 capsule, Rfl: 11 .  ibuprofen (ADVIL,MOTRIN) 200 MG tablet, Take 600 mg by mouth every 6 (six) hours as needed (for pain with yardwork)., Disp: , Rfl:  .  losartan (COZAAR) 25 MG tablet, TAKE 1 TABLET BY MOUTH EVERY MORNING FOR BLOOD PRESSURE, Disp: , Rfl: 3 .  metoprolol succinate (TOPROL-XL) 50 MG 24 hr tablet, Take 50 mg by mouth daily. Take with or immediately following a meal., Disp: , Rfl:  .  Multiple Vitamins-Minerals (MULTIVITAMIN WITH MINERALS) tablet, Take 1 tablet by mouth daily., Disp: , Rfl:  .  Oxcarbazepine (TRILEPTAL) 300 MG tablet, Take 1 tablet (300 mg total) 2 (two) times daily by mouth., Disp: 60 tablet, Rfl: 11 .  promethazine (PHENERGAN) 12.5 MG tablet, Take 12.5 mg by mouth every 8 (eight) hours as needed for nausea or vomiting., Disp: , Rfl:  .  vitamin B-12 (CYANOCOBALAMIN) 500 MCG tablet, Take 500 mcg by mouth daily., Disp: , Rfl:  .  zinc gluconate 50 MG tablet, Take 50 mg by mouth daily., Disp: , Rfl:  .  alum & mag hydroxide-simeth (MAALOX/MYLANTA) 200-200-20 MG/5ML suspension, Take 30 mLs by mouth at bedtime as needed for  indigestion or heartburn., Disp: , Rfl:   PAST MEDICAL HISTORY: Past Medical History:  Diagnosis Date  . CHF (congestive heart failure) ([place])   . DVT (deep venous thrombosis) ([place])   . 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 Hypertension   . Macular pucker, right   . Pulmonary emboli ([place])   . Shortness of breath dyspnea     PAST SURGICAL HISTORY: Past Surgical History:  Procedure Laterality Date  . CATARACT EXTRACTION Right   . LUMBAR SPINE SURGERY      FAMILY HISTORY: Family History  Problem Relation Age of Onset  . Brain cancer Mother   . Stroke Father   . Leukemia Brother     SOCIAL HISTORY:  Social History   Socioeconomic History  .  Marital status: Single    Spouse name: Not on file  . Number of children: Not on file  . Years of education: Not on file  . Highest education level: Not on file  Social Needs  . Financial resource strain: Not on file  . Food insecurity - worry: Not on file  . Food insecurity - inability: Not on file  . Transportation needs - medical: Not on file  . Transportation needs - non-medical: Not on file  Occupational History  . Not on file  Tobacco Use  . Smoking status: Never Smoker  . Smokeless tobacco: Never Used  Substance and Sexual Activity  . Alcohol use: No  . Drug use: No  . Sexual activity: No  Other Topics Concern  . Not on file  Social History Narrative  . Not on file     PHYSICAL EXAM  Vitals:   05/26/17 1459  BP: (!) 146/72  Pulse: 70  Resp: 20  Weight: 276 lb 8 oz (125.4 kg)    Body mass index is 36.48 kg/m.   General: The patient is well-developed and well-nourished and in no acute distress  Extremities:  He has edema to above the ankles bilaterally.   There is some discoloration of the legs.  Musculoskeletal:  Back is nontender  Neurologic Exam  Mental status: The patient is alert and oriented x 3 at the time of the examination. The patient has apparent normal recent and remote memory, with an  apparently normal attention span and concentration ability.   Speech is normal.  Cranial nerves: Extraocular movements are full.   Facial strength and sensation is normal. The tongue is midline, and the patient has symmetric elevation of the soft palate. No obvious hearing deficits are noted.  Motor:  Muscle bulk is normal.   Tone is normal. Strength is  5 / 5 in all 4 extremities except 4+/5 EHL (foot)  Sensory:   He has normal sensation to touch, pain and vibration in the hands. He has mildly reduced sensation to touch and pain at the toes but so there is loss of vibration sensation at the ankles and absent sensation at the toes.  Coordination: Cerebellar testing reveals good finger-nose-finger and heel-to-shin bilaterally.  Gait and station: Station is normal.   Gait is mildly wide based and he looks down as he walks. Tandem gait is very wide . Romberg is positive..   Reflexes: Deep tendon reflexes are symmetric and normal in the arms, 1+ at the knees and absent at the ankles.    DIAGNOSTIC DATA (LABS, IMAGING, TESTING) - I reviewed patient records, labs, notes, testing and imaging myself where available.  Lab Results  Component Value Date   WBC 8.3 01/06/2016   HGB 11.3 (L) 01/06/2016   HCT 34.6 (L) 01/06/2016   MCV 86.3 01/06/2016   PLT 262 01/06/2016      Component Value Date/Time   NA 136 01/06/2016 2358   K 3.7 01/06/2016 2358   CL 107 01/06/2016 2358   CO2 21 (L) 01/06/2016 2358   GLUCOSE 106 (H) 01/06/2016 2358   BUN 15 01/06/2016 2358   CREATININE 0.89 01/06/2016 2358   CALCIUM 7.7 (L) 01/06/2016 2358   PROT 6.7 09/16/2016 1122   ALBUMIN 2.3 (L) 01/05/2016 0245   AST 87 (H) 01/05/2016 0245   ALT 84 (H) 01/05/2016 0245   ALKPHOS 87 01/05/2016 0245   BILITOT 0.8 01/05/2016 0245   GFRNONAA >60 01/06/2016 2358   GFRAA >60 01/06/2016 2358     No results found for: CHOL, HDL, LDLCALC, LDLDIRECT, TRIG, CHOLHDL Lab Results  Component Value Date   HGBA1C 5.6  09/16/2016       ASSESSMENT AND PLAN  Polyneuropathy  Pedal edema - Plan: Compression stockings  Dysesthesia  Gait disturbance  1.   For the dysesthetic quality of his polyneuropathy, continue oxcarbazepine and gabapentin. Refills for one year were provided. 2.    Prescription for Ted stockings was provided. 3.    Remain active and exercises as tolerated. 4.  Return in 1 year or sooner if there are new or worsening neurologic symptoms.  Jamia Hoban A. Felecia Shelling, MD, PhD 40/07/270, 5:36 PM Certified in Neurology, Clinical Neurophysiology, Sleep Medicine, Pain Medicine and Neuroimaging  [facility] [address] [place], [place] [address] [phone]

## 2017-06-17 IMAGING — CR DG CHEST 1V PORT
1 series · 1 of 1 positions shown · non-contrast
Comparison: Chest radiograph 01/04/2016; CT 01/04/2016.

CLINICAL DATA: Patient with hypoxia.

EXAM:
PORTABLE CHEST 1 VIEW

[AP]
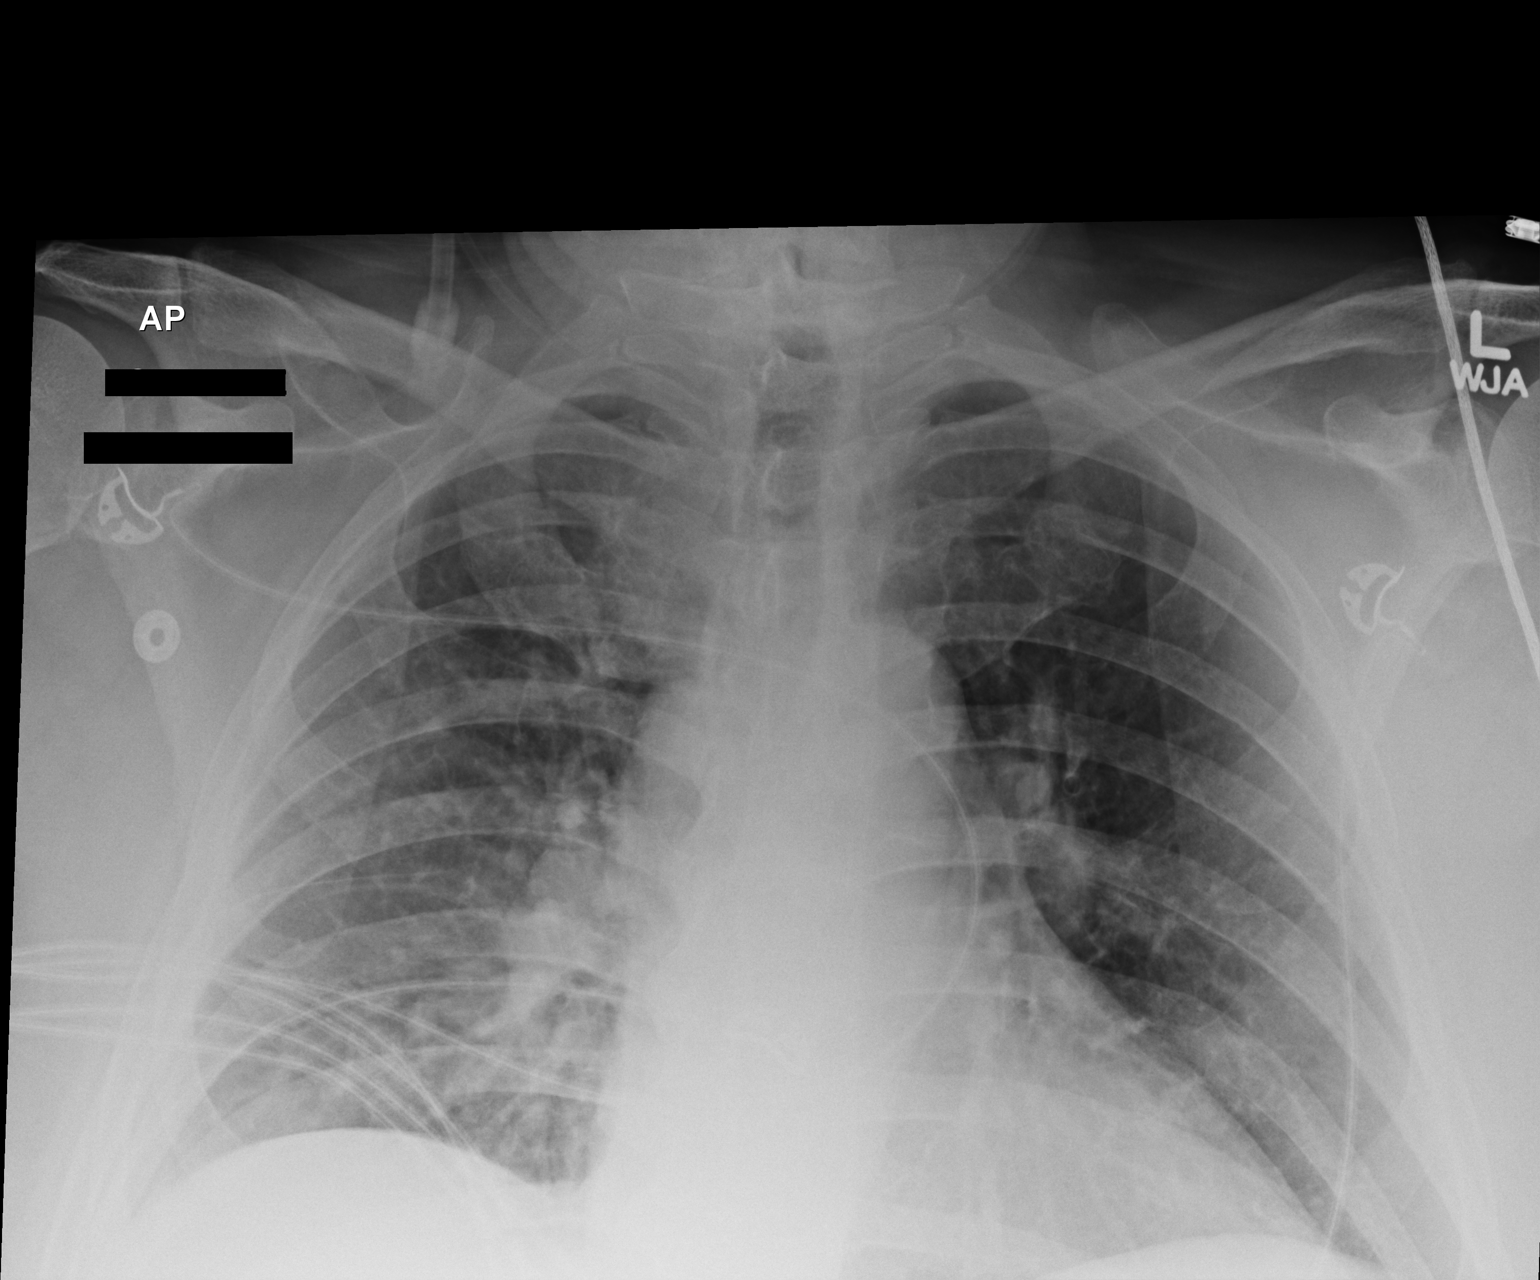

[1 of 1 positions shown; findings below may reference images not displayed]

FINDINGS: Monitoring leads overlie the patient. Stable enlarged cardiac and
mediastinal contours. Unchanged heterogeneous opacities right mid
and lower lung. Small right pleural effusion. No pneumothorax.
IMPRESSION: Heterogeneous opacities right mid and lower lung may represent
combination of atelectasis, infection or pulmonary infarct.

Small right pleural effusion.

## 2018-04-12 ENCOUNTER — Other Ambulatory Visit: Payer: Self-pay | Admitting: Neurology

## 2018-05-31 ENCOUNTER — Ambulatory Visit: Payer: Medicare Other | Admitting: Adult Health

## 2018-06-08 ENCOUNTER — Encounter: Payer: Self-pay | Admitting: Neurology

## 2018-06-08 ENCOUNTER — Ambulatory Visit: Payer: Medicare Other | Admitting: Neurology

## 2018-06-08 VITALS — BP 148/75 | HR 56 | Ht 73.0 in | Wt 261.0 lb

## 2018-06-08 DIAGNOSIS — R600 Localized edema: Secondary | ICD-10-CM

## 2018-06-08 DIAGNOSIS — G629 Polyneuropathy, unspecified: Secondary | ICD-10-CM | POA: Diagnosis not present

## 2018-06-08 DIAGNOSIS — R208 Other disturbances of skin sensation: Secondary | ICD-10-CM | POA: Diagnosis not present

## 2018-06-08 DIAGNOSIS — R269 Unspecified abnormalities of gait and mobility: Secondary | ICD-10-CM

## 2018-06-08 MED ORDER — GABAPENTIN 300 MG PO CAPS: 600.0000 mg | ORAL_CAPSULE | Freq: Two times a day (BID) | ORAL | 11 refills | Status: DC

## 2018-06-08 MED ORDER — OXCARBAZEPINE 300 MG PO TABS: ORAL_TABLET | ORAL | 11 refills | Status: DC

## 2018-06-08 NOTE — Progress Notes (Signed)
  [facility]  PATIENT: Mike Davis DOB: 04/06/1941  REFERRING DOCTOR OR PCP:  David Tapper SOURCE: patient, notes from PCP, labs in EMR, 12/2015 hospital notes  _________________________________   HISTORICAL  CHIEF COMPLAINT:  Chief Complaint  Patient presents with  . Follow-up    RM 13, alone. Last seen 05/26/17. No new sx. Doing well.     HISTORY OF PRESENT ILLNESS:   Update 06/08/2018: Mike Davis is a 77 y.o. 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   Mike Davis has a burning pain in his feet.    Oxcarbazepine 300 mg po bid and gabapentin 600 mg po bid have helped and Mike Davis tolerates them well.    Mike Davis sleeps well at night.   Mike Davis sometimes takes a short nap after Mike Davis exercises.    Mike Davis tries to stay active and walks 75-90 minutes daily.   Mike Davis walks 14000 steps on average most days.    The edema in his legs is doing about the same.    Mike Davis stopped Lasix and is doing the same.    The left foot sometimes feels worse.   Mike Davis does not wear compressive stockings.     Mike Davis denies weakness in his legs.  Labwork for painful neuropathy was normal last year.   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      His legs turn blue if they are not rasied up.   Mike Davis is trying to stay more active ands uses an exercise bike 30 minutes or more most days.    Mike Davis notes more swelling in his feet this year than last year.    Lasix has helped the edema some. Mike Davis has not tried wearing compressive stockings. When Mike Davis wears regular socks, Mike Davis notes that the edema improves under the socks but then Mike Davis has a bulge above the socks  Earlier this year labs were checked. HgbA1c, Cryoglobulins,  SPEP/IEF, B12 and ESR were fine.      Mike Davis is on Trileptal 300 mg po bids and gabapentin 600 mg po bid.     Mike Davis tolerates these medications well and feels that they have helped.  NCV/EMG Conclusion: This NCV/EMG study shows the following: 1.  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 The existence of acute denervation in the more distal muscles implies an ongoing process. 2. There was no evidence of a significant superimposed radiculopathy  __________________________________ From 09/16/2016:  Mike Davis is a 77 yo man who has had dysesthetic foot pain for the past 3 years that has slowly worsened in intensity.    Pain became much worse after lumbar surgery at L3L4 June 2017.   Surgery was complicated by post-operative DVT and PE.   Mike Davis had a second surgery April 30, 2016. Edema in his legs also seemed to worsen after the second operation.    Currently, pain is in both feet up to the ankle.   Pain is worse when Mike Davis sits still and is better with activity.    Riding a recumbent bike helps for a few hours.    Gabapentin 600 mg po bid has helped some initally but less more recently.    Eliquis for DVT/PE was discontinued last week.     Mike Davis denies foot weakness.    Mike Davis has LBP that stays   in the LB without radiation and is distinct from the foot pain in quality.      Mike Davis does not have diabetes mellitus.   Mike Davis has never had an EMG/NCV study.    Mike Davis denies difficulty sleeping.  However, Mike Davis needs to sleep in a chair with his feet up to get comfortable due to back pain if Mike Davis lays flat.   Mike Davis wakes up with minimal edema and notes more in the afternoons after sitting a while (Mike Davis is more active in the mornings[facility] and edema is mild in the mornings)    REVIEW OF SYSTEMS: Constitutional: No fevers, chills, sweats, or change in appetite Eyes: No visual changes, double vision, eye pain Ear, nose and throat: No hearing loss, ear pain, nasal congestion, sore throat Cardiovascular: No chest pain, palpitations.  Respiratory: No shortness of breath at rest or with exertion.   No wheezes.    Recent PE GastrointestinaI: No nausea, vomiting, diarrhea, abdominal pain, fecal incontinence Genitourinary: No dysuria, urinary retention or frequency.  No nocturia. Musculoskeletal: No neck pain, back pain Integumentary/Ext: No rash, pruritus, skin lesions.  Has edema Neurological: as above Psychiatric: No depression at this time.  No anxiety Endocrine: No palpitations, diaphoresis, change in appetite, change in weigh or increased thirst Hematologic/Lymphatic: No anemia, purpura, petechiae. Allergic/Immunologic: No itchy/runny eyes, nasal congestion, recent allergic reactions, rashes  ALLERGIES: Allergies  Allergen Reactions  . Lamictal [Lamotrigine] Rash    HOME MEDICATIONS:  Current Outpatient Medications:  .  acetaminophen (TYLENOL) 325 MG tablet, Take 650 mg by mouth every 6 (six) hours as needed (pain)., Disp: , Rfl:  .  ascorbic acid (VITAMIN C) 500 MG tablet, Take 500 mg by mouth daily., Disp: , Rfl:  .  calcium-vitamin D (OSCAL WITH D) 250-125 MG-UNIT tablet, Take 1 tablet by mouth 2 (two) times daily., Disp: , Rfl:  .  cholecalciferol (VITAMIN D) 1000 units tablet, Take 1,000 Units by mouth daily., Disp: , Rfl:  .  folic acid (FOLVITE) 536 MCG tablet, Take 400 mcg by mouth daily., Disp: , Rfl:  .  gabapentin (NEURONTIN) 300 MG capsule, Take 2 capsules (600 mg total) by mouth 2 (two) times daily., Disp: 120 capsule, Rfl: 11 .  ibuprofen (ADVIL,MOTRIN) 200 MG tablet, Take 600 mg by mouth every 6 (six) hours as needed (for pain with yardwork)., Disp: , Rfl:  .  losartan (COZAAR) 25 MG tablet, TAKE 1 TABLET BY MOUTH EVERY MORNING FOR BLOOD PRESSURE, Disp: , Rfl: 3 .  metoprolol succinate (TOPROL-XL) 50 MG 24 hr tablet, Take 50 mg by mouth daily. Take with or immediately following a meal., Disp: , Rfl:  .  Multiple Vitamins-Minerals (MULTIVITAMIN WITH MINERALS) tablet, Take 1 tablet by mouth daily., Disp: , Rfl:  .  Oxcarbazepine (TRILEPTAL) 300 MG tablet, Take 1 tablet three  times daily., Disp: 90 tablet, Rfl: 11 .  pantoprazole (PROTONIX) 40 MG tablet, Take 40 mg by mouth daily., Disp: , Rfl:  .  vitamin B-12 (CYANOCOBALAMIN) 500 MCG tablet, Take 500 mcg by mouth daily., Disp: , Rfl:  .  zinc gluconate 50 MG tablet, Take 50 mg by mouth daily., Disp: , Rfl:   PAST MEDICAL HISTORY: Past Medical History:  Diagnosis Date  . CHF (congestive heart failure) ([place])   . DVT (deep venous thrombosis) ([place])   . 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 Hypertension   . Macular pucker, right   . Pulmonary emboli ([place])   . Shortness  of breath dyspnea     PAST SURGICAL HISTORY: Past Surgical History:  Procedure Laterality Date  . CATARACT EXTRACTION Right   . LUMBAR SPINE SURGERY      FAMILY HISTORY: Family History  Problem Relation Age of Onset  . Brain cancer Mother   . Stroke Father   . Leukemia Brother     SOCIAL HISTORY:  Social History   Socioeconomic History  . Marital status: Single    Spouse name: Not on file  . Number of children: Not on file  . Years of education: Not on file  . Highest education level: Not on file  Occupational History  . Not on file  Social Needs  . Financial resource strain: Not on file  . Food insecurity:    Worry: Not on file    Inability: Not on file  . Transportation needs:    Medical: Not on file    Non-medical: Not on file  Tobacco Use  . Smoking status: Never Smoker  . Smokeless tobacco: Never Used  Substance and Sexual Activity  . Alcohol use: No  . Drug use: No  . Sexual activity: Never  Lifestyle  . Physical activity:    Days per week: Not on file    Minutes per session: Not on file  . Stress: Not on file  Relationships  . Social connections:    Talks on phone: Not on file    Gets together: Not on file    Attends religious service: Not on file    Active member of club or organization: Not on file    Attends meetings of clubs or organizations: Not on file    Relationship status: Not on file  .  Intimate partner violence:    Fear of current or ex partner: Not on file    Emotionally abused: Not on file    Physically abused: Not on file    Forced sexual activity: Not on file  Other Topics Concern  . Not on file  Social History Narrative  . Not on file     PHYSICAL EXAM  Vitals:   06/08/18 0849  BP: (!) 148/75  Pulse: (!) 56  Weight: 261 lb (118.4 kg)  Height: 6' 1" (1.854 m)    Body mass index is 34.43 kg/m.   General: The patient is well-developed and well-nourished and in no acute distress  Extremities:  Bilateral mild ankle edema and discoloration of ankles.  Musculoskeletal:  Back is nontender  Neurologic Exam  Mental status: The patient is alert and oriented x 3 at the time of the examination. The patient has apparent normal recent and remote memory, with an apparently normal attention span and concentration ability.   Speech is normal.  Cranial nerves: Extraocular movements are full.   Facial strength and sensation is normal. The tongue is midline, and the patient has symmetric elevation of the soft palate. No obvious hearing deficits are noted.  Motor:  Muscle bulk is normal.   Tone is normal. Strength is  5 / 5 in all 4 extremities except 4+/5 EHL (foot)  Sensory:   Mike Davis has normal sensation to touch, pain and vibration in the hands. Absent vibration at toes/ankles and reduced touch in feet.    Coordination: Cerebellar testing shows good finger nose finger and heel to shin.   Gait and station: Station is normal.   Gait is mildly wide based and Mike Davis looks down as Mike Davis walks. Tandem gait is wide . Romberg is borderline.Marland Kitchen  Reflexes: Deep tendon reflexes are symmetric and normal in the arms, 1+ at the knees and absent at the ankles.    DIAGNOSTIC DATA (LABS, IMAGING, TESTING) - I reviewed patient records, labs, notes, testing and imaging myself where available.  Lab Results  Component Value Date   WBC 8.3 01/06/2016   HGB 11.3 (L) 01/06/2016   HCT 34.6  (L) 01/06/2016   MCV 86.3 01/06/2016   PLT 262 01/06/2016      Component Value Date/Time   NA 136 01/06/2016 2358   K 3.7 01/06/2016 2358   CL 107 01/06/2016 2358   CO2 21 (L) 01/06/2016 2358   GLUCOSE 106 (H) 01/06/2016 2358   BUN 15 01/06/2016 2358   CREATININE 0.89 01/06/2016 2358   CALCIUM 7.7 (L) 01/06/2016 2358   PROT 6.7 09/16/2016 1122   ALBUMIN 2.3 (L) 01/05/2016 0245   AST 87 (H) 01/05/2016 0245   ALT 84 (H) 01/05/2016 0245   ALKPHOS 87 01/05/2016 0245   BILITOT 0.8 01/05/2016 0245   GFRNONAA >60 01/06/2016 2358   GFRAA >60 01/06/2016 2358   No results found for: CHOL, HDL, LDLCALC, LDLDIRECT, TRIG, CHOLHDL Lab Results  Component Value Date   HGBA1C 5.6 09/16/2016       ASSESSMENT AND PLAN  Polyneuropathy  Gait disturbance  Pedal edema  Dysesthesia  1.   Increase oxcarbazepine to 300-600 and continue gabapentin 600-600. Refills for one year were provided. 2.   Continue to exercise as Mike Davis is doing.   3.   Return in 1 year or sooner if there are new or worsening neurologic symptoms.    If Dr. Sasser or PCP is willing to write medications we can just make follow up as needed.   Richard A. Sater, MD, PhD 06/08/2018, 9:31 AM Certified in Neurology, Clinical Neurophysiology, Sleep Medicine, Pain Medicine and Neuroimaging  [facility] [address], [address] [place], [place] [address] [phone]    

## 2018-10-10 ENCOUNTER — Other Ambulatory Visit: Payer: Self-pay | Admitting: Neurology

## 2019-06-14 ENCOUNTER — Encounter: Payer: Self-pay | Admitting: Neurology

## 2019-06-14 ENCOUNTER — Other Ambulatory Visit: Payer: Self-pay

## 2019-06-14 ENCOUNTER — Ambulatory Visit: Payer: Medicare Other | Admitting: Neurology

## 2019-06-14 VITALS — BP 171/75 | HR 51 | Temp 97.6°F | Ht 73.0 in | Wt 263.0 lb

## 2019-06-14 DIAGNOSIS — G629 Polyneuropathy, unspecified: Secondary | ICD-10-CM

## 2019-06-14 DIAGNOSIS — R208 Other disturbances of skin sensation: Secondary | ICD-10-CM

## 2019-06-14 DIAGNOSIS — R269 Unspecified abnormalities of gait and mobility: Secondary | ICD-10-CM

## 2019-06-14 MED ORDER — OXCARBAZEPINE 300 MG PO TABS: ORAL_TABLET | ORAL | 4 refills | Status: AC

## 2019-06-14 NOTE — Progress Notes (Signed)
[facility]  PATIENT: Mike Davis DOB: 02-05-1941  REFERRING DOCTOR OR PCP:  Matthias Hughs SOURCE: patient, notes from PCP, labs in EMR, 12/2015 hospital notes  _________________________________   HISTORICAL  CHIEF COMPLAINT:  Chief Complaint  Patient presents with  . Follow-up    Rm 12 alone here for 1 year f/u on Polyneuropathy reports he has been doing well only change dx of right hip arthritis,    HISTORY OF PRESENT ILLNESS:   Update 06/14/2019: He feels his polyneuropathy pain is doing better.  The burning dysesthesias are less intense.   The pain is in the feet and ankles but not higher.     He gets some edema in his feet/ankles and pain is worse in the afternoons when he has more swelling.   He is staying active and takes a one mile walk twice a day and uses a recumbent bicycle.   He is on gabapentin 900 mg po bid and oxcarbazepine 600 mg po in the am and 300 mg po qAM.     He was found recently to have hip arthritis.   Pain is worse when he initially walks and better after he walks further.   He has had xrays.   He has no hip pain with the recumbent bicycle  Update 06/08/2018: Mike Davis is a 78 y.o. 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   He has a burning pain in his feet.    Oxcarbazepine 300 mg po bid and gabapentin 600 mg po bid have helped and he tolerates them well.    He sleeps well at night.   He sometimes takes a short nap after he exercises.    He tries to stay active and walks 75-90 minutes daily.   He walks 14000 steps on average most days.    The edema in his legs is doing about the same.    He stopped Lasix and is doing the same.    The left foot sometimes feels worse.   He does not wear compressive stockings.     He denies weakness in his legs.  Labwork for painful neuropathy was normal last year.   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      His legs turn blue if they are not rasied up.   He is trying to stay more active ands uses an exercise bike 30 minutes or more most days.    He notes more swelling in his feet this year than last year.    Lasix has helped the edema some. He has not tried wearing compressive stockings. When he wears regular socks, he notes that the edema improves under the socks but then he has a bulge above the socks  Earlier this year labs were checked. HgbA1c, Cryoglobulins,  SPEP/IEF, B12 and ESR were fine.      He is on Trileptal 300 mg po bids and gabapentin 600 mg po bid.    he tolerates these medications well and feels that they have helped.  NCV/EMG Conclusion: This NCV/EMG study shows the following: 1.  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 The existence of acute denervation in the more distal muscles implies an ongoing process. 2. There was no evidence of a significant superimposed radiculopathy  __________________________________ From  09/16/2016:  He is a 78 yo man who has had dysesthetic foot pain for the past 3 years that has slowly worsened in intensity.    Pain became much worse after lumbar surgery at L3L4 June 2017.   Surgery was complicated by post-operative DVT and PE.   He had a second surgery April 30, 2016. Edema in his legs also seemed to worsen after the second operation.    Currently, pain is in both feet up to the ankle.   Pain is worse when he sits still and is better with activity.    Riding a recumbent bike helps for a few hours.    Gabapentin 600 mg po bid has helped some initally but less more recently.    Eliquis for DVT/PE was discontinued last week.     He denies foot weakness.    He has LBP that stays in the LB without radiation and is distinct from the foot pain in quality.      He does not have  diabetes mellitus.   He has never had an EMG/NCV study.    He denies difficulty sleeping.  However, he needs to sleep in a chair with his feet up to get comfortable due to back pain if he lays flat.   He wakes up with minimal edema and notes more in the afternoons after sitting a while (he is more active in the mornings, [facility] and edema is mild in the mornings)    REVIEW OF SYSTEMS: Constitutional: No fevers, chills, sweats, or change in appetite Eyes: No visual changes, double vision, eye pain Ear, nose and throat: No hearing loss, ear pain, nasal congestion, sore throat Cardiovascular: No chest pain, palpitations.  Respiratory: No shortness of breath at rest or with exertion.   No wheezes.   Recent PE GastrointestinaI: No nausea, vomiting, diarrhea, abdominal pain, fecal incontinence Genitourinary: No dysuria, urinary retention or frequency.  No nocturia. Musculoskeletal: No neck pain, back pain Integumentary/Ext: No rash, pruritus, skin lesions.  Has edema Neurological: as above Psychiatric: No depression at this time.  No anxiety Endocrine: No palpitations, diaphoresis, change in appetite, change in weigh or increased thirst Hematologic/Lymphatic: No anemia, purpura, petechiae. Allergic/Immunologic: No itchy/runny eyes, nasal congestion, recent allergic reactions, rashes  ALLERGIES: Allergies  Allergen Reactions  . Lamictal [Lamotrigine] Rash    HOME MEDICATIONS:  Current Outpatient Medications:  .  acetaminophen (TYLENOL) 325 MG tablet, Take 650 mg by mouth every 6 (six) hours as needed (pain)., Disp: , Rfl:  .  ascorbic acid (VITAMIN C) 500 MG tablet, Take 500 mg by mouth daily., Disp: , Rfl:  .  calcium-vitamin D (OSCAL WITH D) 250-125 MG-UNIT tablet, Take 1 tablet by mouth 2 (two) times daily., Disp: , Rfl:  .  cholecalciferol (VITAMIN D) 1000 units tablet, Take 1,000 Units by mouth daily., Disp: , Rfl:  .  folic acid (FOLVITE) 768 MCG tablet, Take 400 mcg by  mouth daily., Disp: , Rfl:  .  gabapentin (NEURONTIN) 300 MG capsule, TAKE 2 CAPSULES (600 MG TOTAL) BY MOUTH 2 (TWO) TIMES DAILY. (Patient taking differently: Take 300 mg by mouth 3 (three) times daily. Taking 3 tabs in the am 3 in the pm), Disp: 360 capsule, Rfl: 2 .  ibuprofen (ADVIL,MOTRIN) 200 MG tablet, Take 600 mg by mouth every 6 (six) hours as needed (for pain with yardwork)., Disp: , Rfl:  .  losartan (COZAAR) 25 MG tablet, TAKE 1 TABLET BY MOUTH EVERY MORNING FOR BLOOD PRESSURE,  Disp: , Rfl: 3 .  meloxicam (MOBIC) 15 MG tablet, Take 15 mg by mouth daily., Disp: , Rfl:  .  metoprolol succinate (TOPROL-XL) 50 MG 24 hr tablet, Take 50 mg by mouth daily. Take with or immediately following a meal., Disp: , Rfl:  .  Multiple Vitamins-Minerals (MULTIVITAMIN WITH MINERALS) tablet, Take 1 tablet by mouth daily., Disp: , Rfl:  .  Oxcarbazepine (TRILEPTAL) 300 MG tablet, Take 1 tablet three times daily., Disp: 270 tablet, Rfl: 4 .  vitamin B-12 (CYANOCOBALAMIN) 500 MCG tablet, Take 500 mcg by mouth daily., Disp: , Rfl:  .  zinc gluconate 50 MG tablet, Take 50 mg by mouth daily., Disp: , Rfl:   PAST MEDICAL HISTORY: Past Medical History:  Diagnosis Date  . CHF (congestive heart failure) ([place])   . DVT (deep venous thrombosis) ([place])   . 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 Hypertension   . Macular pucker, right   . Pulmonary emboli ([place])   . Shortness of breath dyspnea     PAST SURGICAL HISTORY: Past Surgical History:  Procedure Laterality Date  . CATARACT EXTRACTION Right   . LUMBAR SPINE SURGERY      FAMILY HISTORY: Family History  Problem Relation Age of Onset  . Brain cancer Mother   . Stroke Father   . Leukemia Brother     SOCIAL HISTORY:  Social History   Socioeconomic History  . Marital status: Single    Spouse name: Not on file  . Number of children: Not on file  . Years of education: Not on file  . Highest education level: Not on file  Occupational History  .  Not on file  Social Needs  . Financial resource strain: Not on file  . Food insecurity    Worry: Not on file    Inability: Not on file  . Transportation needs    Medical: Not on file    Non-medical: Not on file  Tobacco Use  . Smoking status: Never Smoker  . Smokeless tobacco: Never Used  Substance and Sexual Activity  . Alcohol use: No  . Drug use: No  . Sexual activity: Never  Lifestyle  . Physical activity    Days per week: Not on file    Minutes per session: Not on file  . Stress: Not on file  Relationships  . Social Herbalist on phone: Not on file    Gets together: Not on file    Attends religious service: Not on file    Active member of club or organization: Not on file    Attends meetings of clubs or organizations: Not on file    Relationship status: Not on file  . Intimate partner violence    Fear of current or ex partner: Not on file    Emotionally abused: Not on file    Physically abused: Not on file    Forced sexual activity: Not on file  Other Topics Concern  . Not on file  Social History Narrative  . Not on file     PHYSICAL EXAM  Vitals:   06/14/19 1039  BP: (!) 171/75  Pulse: (!) 51  Temp: 97.6 F (36.4 C)  TempSrc: Temporal  Weight: 263 lb (119.3 kg)  Height: 6' 1" (1.854 m)    Body mass index is 34.7 kg/m.   General: The patient is well-developed and well-nourished and in no acute distress  Extremities: He has mild bilateral ankle edema and  mild discoloration of ankles.  Musculoskeletal:  Back is nontender  Neurologic Exam  Mental status: The patient is alert and oriented x 3 at the time of the examination. The patient has apparent normal recent and remote memory, with an apparently normal attention span and concentration ability.   Speech is normal.  Cranial nerves: Extraocular movements are full.   Facial strength and sensation is normal.    Motor:  Muscle bulk is normal.   Tone is normal. Strength is  5 / 5 in all 4  extremities except 4+/5 EHL (foot)  Sensory:   He has normal sensation to touch, pain and vibration in the hands.  He has reduced sensation to vibration at the ankles and absent sensation to vibration in the feet  Coordination: Cerebellar testing shows good finger nose finger and heel to shin.   Gait and station: Station is normal.   Gait is mildly wide based and he looks down as he walks. Tandem gait is wide . Romberg is borderline..   Reflexes: Deep tendon reflexes are symmetric and normal in the arms, 1+ at the knees and absent at the ankles.    DIAGNOSTIC DATA (LABS, IMAGING, TESTING) - I reviewed patient records, labs, notes, testing and imaging myself where available.  Lab Results  Component Value Date   WBC 8.3 01/06/2016   HGB 11.3 (L) 01/06/2016   HCT 34.6 (L) 01/06/2016   MCV 86.3 01/06/2016   PLT 262 01/06/2016      Component Value Date/Time   NA 136 01/06/2016 2358   K 3.7 01/06/2016 2358   CL 107 01/06/2016 2358   CO2 21 (L) 01/06/2016 2358   GLUCOSE 106 (H) 01/06/2016 2358   BUN 15 01/06/2016 2358   CREATININE 0.89 01/06/2016 2358   CALCIUM 7.7 (L) 01/06/2016 2358   PROT 6.7 09/16/2016 1122   ALBUMIN 2.3 (L) 01/05/2016 0245   AST 87 (H) 01/05/2016 0245   ALT 84 (H) 01/05/2016 0245   ALKPHOS 87 01/05/2016 0245   BILITOT 0.8 01/05/2016 0245   GFRNONAA >60 01/06/2016 2358   GFRAA >60 01/06/2016 2358   No results found for: CHOL, HDL, LDLCALC, LDLDIRECT, TRIG, CHOLHDL Lab Results  Component Value Date   HGBA1C 5.6 09/16/2016       ASSESSMENT AND PLAN  Polyneuropathy  Dysesthesia  Gait disturbance  1.   Continue oxcarbazepine 300 mg qAM and 600 mg qHS and continue gabapentin 900 bid. Refills for one year were provided. 2.   Continue to exercise as he is doing.   3.   Return in 1 year or sooner if there are new or worsening neurologic symptoms.    If Dr. Quintin Alto or PCP is willing to write medications we can just make follow up as needed.   Richard  A. Felecia Shelling, MD, PhD 44/07/270, 5:36 PM Certified in Neurology, Clinical Neurophysiology, Sleep Medicine, Pain Medicine and Neuroimaging  [facility] [address] [place], [place] [address] [phone]

## 2019-09-01 ENCOUNTER — Ambulatory Visit: Payer: Medicare PPO | Attending: Internal Medicine

## 2019-09-17 ENCOUNTER — Other Ambulatory Visit: Payer: Self-pay | Admitting: Neurology

## 2019-10-04 DIAGNOSIS — C4442 Squamous cell carcinoma of skin of scalp and neck: Secondary | ICD-10-CM | POA: Diagnosis not present

## 2019-10-04 DIAGNOSIS — L570 Actinic keratosis: Secondary | ICD-10-CM | POA: Diagnosis not present

## 2019-10-04 DIAGNOSIS — D485 Neoplasm of uncertain behavior of skin: Secondary | ICD-10-CM | POA: Diagnosis not present

## 2019-10-05 DIAGNOSIS — Z961 Presence of intraocular lens: Secondary | ICD-10-CM | POA: Diagnosis not present

## 2019-10-05 DIAGNOSIS — H35372 Puckering of macula, left eye: Secondary | ICD-10-CM | POA: Diagnosis not present

## 2019-10-05 DIAGNOSIS — H26491 Other secondary cataract, right eye: Secondary | ICD-10-CM | POA: Diagnosis not present

## 2019-10-05 DIAGNOSIS — H2512 Age-related nuclear cataract, left eye: Secondary | ICD-10-CM | POA: Diagnosis not present

## 2019-10-05 DIAGNOSIS — H43812 Vitreous degeneration, left eye: Secondary | ICD-10-CM | POA: Diagnosis not present

## 2019-12-05 DIAGNOSIS — E782 Mixed hyperlipidemia: Secondary | ICD-10-CM | POA: Diagnosis not present

## 2019-12-05 DIAGNOSIS — R7301 Impaired fasting glucose: Secondary | ICD-10-CM | POA: Diagnosis not present

## 2019-12-05 DIAGNOSIS — I10 Essential (primary) hypertension: Secondary | ICD-10-CM | POA: Diagnosis not present

## 2019-12-05 DIAGNOSIS — N183 Chronic kidney disease, stage 3 unspecified: Secondary | ICD-10-CM | POA: Diagnosis not present

## 2019-12-08 DIAGNOSIS — N401 Enlarged prostate with lower urinary tract symptoms: Secondary | ICD-10-CM | POA: Diagnosis not present

## 2019-12-08 DIAGNOSIS — R1084 Generalized abdominal pain: Secondary | ICD-10-CM | POA: Diagnosis not present

## 2019-12-08 DIAGNOSIS — E782 Mixed hyperlipidemia: Secondary | ICD-10-CM | POA: Diagnosis not present

## 2019-12-08 DIAGNOSIS — Z23 Encounter for immunization: Secondary | ICD-10-CM | POA: Diagnosis not present

## 2019-12-08 DIAGNOSIS — R7301 Impaired fasting glucose: Secondary | ICD-10-CM | POA: Diagnosis not present

## 2019-12-08 DIAGNOSIS — Z6835 Body mass index (BMI) 35.0-35.9, adult: Secondary | ICD-10-CM | POA: Diagnosis not present

## 2019-12-08 DIAGNOSIS — I10 Essential (primary) hypertension: Secondary | ICD-10-CM | POA: Diagnosis not present

## 2020-01-12 ENCOUNTER — Ambulatory Visit (INDEPENDENT_AMBULATORY_CARE_PROVIDER_SITE_OTHER): Payer: Medicare PPO | Admitting: Ophthalmology

## 2020-01-12 ENCOUNTER — Encounter (INDEPENDENT_AMBULATORY_CARE_PROVIDER_SITE_OTHER): Payer: Self-pay | Admitting: Ophthalmology

## 2020-01-12 ENCOUNTER — Other Ambulatory Visit: Payer: Self-pay

## 2020-01-12 DIAGNOSIS — H26491 Other secondary cataract, right eye: Secondary | ICD-10-CM | POA: Insufficient documentation

## 2020-01-12 DIAGNOSIS — H2512 Age-related nuclear cataract, left eye: Secondary | ICD-10-CM | POA: Diagnosis not present

## 2020-01-12 DIAGNOSIS — H35373 Puckering of macula, bilateral: Secondary | ICD-10-CM

## 2020-01-12 DIAGNOSIS — H35371 Puckering of macula, right eye: Secondary | ICD-10-CM | POA: Insufficient documentation

## 2020-01-12 DIAGNOSIS — H35372 Puckering of macula, left eye: Secondary | ICD-10-CM | POA: Insufficient documentation

## 2020-01-12 NOTE — Progress Notes (Signed)
01/12/2020     CHIEF COMPLAINT Patient presents for Retina Follow Up   HISTORY OF PRESENT ILLNESS: Mike Davis is a 79 y.o. 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  Severity is moderate.  Duration of 1 year.  Since onset it is stable.  I, the attending physician,  performed the HPI with the patient and updated documentation appropriately.          Comments    1 Year f\u OU. OCT  Pt states no changes in vision. Pt sees an occasional floater. Denies FOL. Pt saw Dr. Katy Fitch at the beginning of the year       Last edited by Tilda Franco on 01/12/2020  1:01 PM. (History)      Referring physician: Manon Hilding, MD [place],  [place] [id number]  HISTORICAL INFORMATION:   Selected notes from the MEDICAL RECORD NUMBER    Lab Results  Component Value Date   HGBA1C 5.6 09/16/2016     CURRENT MEDICATIONS: No current outpatient medications on file. (Ophthalmic Drugs)   No current facility-administered medications for this visit. (Ophthalmic Drugs)   Current Outpatient Medications (Other)  Medication Sig  . acetaminophen (TYLENOL) 325 MG tablet Take 650 mg by mouth every 6 (six) hours as needed (pain).  Marland Kitchen ascorbic acid (VITAMIN C) 500 MG tablet Take 500 mg by mouth daily.  . calcium-vitamin D (OSCAL WITH D) 250-125 MG-UNIT tablet Take 1 tablet by mouth 2 (two) times daily.  . cholecalciferol (VITAMIN D) 1000 units tablet Take 1,000 Units by mouth daily.  . folic acid (FOLVITE) 191 MCG tablet Take 400 mcg by mouth daily.  Marland Kitchen gabapentin (NEURONTIN) 300 MG capsule TAKE 2 CAPSULES (600 MG TOTAL) BY MOUTH 2 (TWO) TIMES DAILY.  Marland Kitchen ibuprofen (ADVIL,MOTRIN) 200 MG tablet Take 600 mg by mouth every 6 (six) hours as needed (for pain with yardwork).  . losartan (COZAAR) 25 MG tablet TAKE 1 TABLET BY MOUTH EVERY MORNING FOR BLOOD PRESSURE  . meloxicam (MOBIC) 15 MG tablet Take 15 mg by mouth daily.  . metoprolol  succinate (TOPROL-XL) 50 MG 24 hr tablet Take 50 mg by mouth daily. Take with or immediately following a meal.  . Multiple Vitamins-Minerals (MULTIVITAMIN WITH MINERALS) tablet Take 1 tablet by mouth daily.  . Oxcarbazepine (TRILEPTAL) 300 MG tablet Take 1 tablet three times daily.  . vitamin B-12 (CYANOCOBALAMIN) 500 MCG tablet Take 500 mcg by mouth daily.  Marland Kitchen zinc gluconate 50 MG tablet Take 50 mg by mouth daily.   No current facility-administered medications for this visit. (Other)      REVIEW OF SYSTEMS:    ALLERGIES Allergies  Allergen Reactions  . Lamictal [Lamotrigine] Rash    PAST MEDICAL HISTORY Past Medical History:  Diagnosis Date  . CHF (congestive heart failure) ([place])   . DVT (deep venous thrombosis) ([place])   . 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 Hypertension   . Macular pucker, right   . Pulmonary emboli ([place])   . Shortness of breath dyspnea    Past Surgical History:  Procedure Laterality Date  . CATARACT EXTRACTION Right   . LUMBAR SPINE SURGERY      FAMILY HISTORY Family History  Problem Relation Age of Onset  . Brain cancer Mother   . Stroke Father   . Leukemia Brother     SOCIAL  HISTORY Social History   Tobacco Use  . Smoking status: Never Smoker  . Smokeless tobacco: Never Used  Substance Use Topics  . Alcohol use: No  . Drug use: No         OPHTHALMIC EXAM: Base Eye Exam    Visual Acuity (Snellen - Linear)      Right Left   Dist cc 20/30 -1 20/25 -2   Correction: Glasses       Tonometry (Tonopen, 1:03 PM)      Right Left   Pressure 13 14       Pupils      Pupils Dark Light Shape React APD   Right PERRL 4 3 Round Brisk None   Left PERRL 4 3 Round Brisk None       Visual Fields (Counting fingers)      Left Right    Full Full       Neuro/Psych    Oriented x3: Yes   Mood/Affect: Normal       Dilation    Both eyes: 1.0% Mydriacyl, 2.5% Phenylephrine @ 1:03 PM        Slit Lamp and Fundus Exam    Slit Lamp  Exam      Right Left   Lens Centered posterior chamber intraocular lens           IMAGING AND PROCEDURES  Imaging and Procedures for 01/12/20           ASSESSMENT/PLAN:  No problem-specific Assessment & Plan notes found for this encounter.      ICD-10-CM   1. Left epiretinal membrane  H35.372 OCT, Retina - OU - Both Eyes  2. Right epiretinal membrane  H35.371 OCT, Retina - OU - Both Eyes  3. 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  2.  3.  Ophthalmic Meds Ordered this visit:  No orders of the defined types were placed in this encounter.      No follow-ups on file.  There are no Patient Instructions on file for this visit.   Explained the diagnoses, plan, and follow up with the patient and they expressed understanding.  Patient expressed understanding of the importance of proper follow up care.   Clent Demark Kineta Fudala M.D. Diseases & Surgery of the Retina and Vitreous Retina & Diabetic [place] 01/12/20     Abbreviations: M myopia (nearsighted); A astigmatism; H hyperopia (farsighted); P presbyopia; Mrx spectacle prescription;  CTL contact lenses; OD right eye; OS left eye; OU both eyes  XT exotropia; ET esotropia; PEK punctate epithelial keratitis; PEE punctate epithelial erosions; DES dry eye syndrome; MGD meibomian gland dysfunction; ATs artificial tears; PFAT's preservative free artificial tears; [place] nuclear sclerotic cataract; PSC posterior subcapsular cataract; ERM epi-retinal membrane; PVD posterior vitreous detachment; RD retinal detachment; DM diabetes mellitus; DR diabetic retinopathy; NPDR non-proliferative diabetic retinopathy; PDR proliferative diabetic retinopathy; CSME clinically significant macular edema; DME diabetic macular edema; dbh dot blot hemorrhages; CWS cotton wool spot; POAG primary open angle glaucoma; C/D cup-to-disc ratio; HVF humphrey visual field; GVF goldmann visual field; OCT optical  coherence tomography; IOP intraocular pressure; BRVO Branch retinal vein occlusion; CRVO central retinal vein occlusion; CRAO central retinal artery occlusion; BRAO branch retinal artery occlusion; RT retinal tear; SB scleral buckle; PPV pars plana vitrectomy; VH Vitreous hemorrhage; PRP panretinal laser photocoagulation; IVK intravitreal kenalog; VMT vitreomacular traction; MH Macular hole;  NVD neovascularization of the disc; NVE neovascularization elsewhere; AREDS age related eye disease  study; ARMD age related macular degeneration; POAG primary open angle glaucoma; EBMD epithelial/anterior basement membrane dystrophy; ACIOL anterior chamber intraocular lens; IOL intraocular lens; PCIOL posterior chamber intraocular lens; Phaco/IOL phacoemulsification with intraocular lens placement; [place] photorefractive keratectomy; LASIK laser assisted in situ keratomileusis; HTN hypertension; DM diabetes mellitus; COPD chronic obstructive pulmonary disease

## 2020-01-12 NOTE — Assessment & Plan Note (Signed)
OS, this is minor over the last 7 years

## 2020-02-24 DIAGNOSIS — L259 Unspecified contact dermatitis, unspecified cause: Secondary | ICD-10-CM | POA: Diagnosis not present

## 2020-02-24 DIAGNOSIS — Z6836 Body mass index (BMI) 36.0-36.9, adult: Secondary | ICD-10-CM | POA: Diagnosis not present

## 2020-02-24 DIAGNOSIS — L02416 Cutaneous abscess of left lower limb: Secondary | ICD-10-CM | POA: Diagnosis not present

## 2020-04-09 DIAGNOSIS — D044 Carcinoma in situ of skin of scalp and neck: Secondary | ICD-10-CM | POA: Diagnosis not present

## 2020-04-09 DIAGNOSIS — C4442 Squamous cell carcinoma of skin of scalp and neck: Secondary | ICD-10-CM | POA: Diagnosis not present

## 2020-04-09 DIAGNOSIS — D485 Neoplasm of uncertain behavior of skin: Secondary | ICD-10-CM | POA: Diagnosis not present

## 2020-04-09 DIAGNOSIS — L570 Actinic keratosis: Secondary | ICD-10-CM | POA: Diagnosis not present

## 2020-04-24 DIAGNOSIS — C4442 Squamous cell carcinoma of skin of scalp and neck: Secondary | ICD-10-CM | POA: Diagnosis not present

## 2020-04-24 DIAGNOSIS — L0100 Impetigo, unspecified: Secondary | ICD-10-CM | POA: Diagnosis not present

## 2020-05-24 DIAGNOSIS — Z23 Encounter for immunization: Secondary | ICD-10-CM | POA: Diagnosis not present

## 2020-05-29 ENCOUNTER — Encounter: Payer: Self-pay | Admitting: *Deleted

## 2020-06-05 DIAGNOSIS — R7301 Impaired fasting glucose: Secondary | ICD-10-CM | POA: Diagnosis not present

## 2020-06-05 DIAGNOSIS — N183 Chronic kidney disease, stage 3 unspecified: Secondary | ICD-10-CM | POA: Diagnosis not present

## 2020-06-05 DIAGNOSIS — E782 Mixed hyperlipidemia: Secondary | ICD-10-CM | POA: Diagnosis not present

## 2020-06-05 DIAGNOSIS — E8881 Metabolic syndrome: Secondary | ICD-10-CM | POA: Diagnosis not present

## 2020-06-08 DIAGNOSIS — I10 Essential (primary) hypertension: Secondary | ICD-10-CM | POA: Diagnosis not present

## 2020-06-08 DIAGNOSIS — Z0001 Encounter for general adult medical examination with abnormal findings: Secondary | ICD-10-CM | POA: Diagnosis not present

## 2020-06-08 DIAGNOSIS — J3489 Other specified disorders of nose and nasal sinuses: Secondary | ICD-10-CM | POA: Diagnosis not present

## 2020-06-08 DIAGNOSIS — Z1389 Encounter for screening for other disorder: Secondary | ICD-10-CM | POA: Diagnosis not present

## 2020-06-08 DIAGNOSIS — Z23 Encounter for immunization: Secondary | ICD-10-CM | POA: Diagnosis not present

## 2020-06-08 DIAGNOSIS — Z1331 Encounter for screening for depression: Secondary | ICD-10-CM | POA: Diagnosis not present

## 2020-06-08 DIAGNOSIS — N289 Disorder of kidney and ureter, unspecified: Secondary | ICD-10-CM | POA: Diagnosis not present

## 2020-06-08 DIAGNOSIS — N401 Enlarged prostate with lower urinary tract symptoms: Secondary | ICD-10-CM | POA: Diagnosis not present

## 2020-06-13 ENCOUNTER — Ambulatory Visit: Payer: Medicare Other | Admitting: Family Medicine

## 2020-07-09 DIAGNOSIS — J310 Chronic rhinitis: Secondary | ICD-10-CM | POA: Diagnosis not present

## 2020-07-09 DIAGNOSIS — J343 Hypertrophy of nasal turbinates: Secondary | ICD-10-CM | POA: Diagnosis not present

## 2020-07-09 DIAGNOSIS — J330 Polyp of nasal cavity: Secondary | ICD-10-CM | POA: Diagnosis not present

## 2020-07-09 DIAGNOSIS — M950 Acquired deformity of nose: Secondary | ICD-10-CM | POA: Diagnosis not present

## 2020-08-21 DIAGNOSIS — J342 Deviated nasal septum: Secondary | ICD-10-CM | POA: Diagnosis not present

## 2020-08-21 DIAGNOSIS — C44391 Other specified malignant neoplasm of skin of nose: Secondary | ICD-10-CM | POA: Diagnosis not present

## 2020-08-21 DIAGNOSIS — J3489 Other specified disorders of nose and nasal sinuses: Secondary | ICD-10-CM | POA: Diagnosis not present

## 2020-08-21 DIAGNOSIS — R040 Epistaxis: Secondary | ICD-10-CM | POA: Diagnosis not present

## 2020-08-21 DIAGNOSIS — C300 Malignant neoplasm of nasal cavity: Secondary | ICD-10-CM | POA: Diagnosis not present

## 2020-08-21 DIAGNOSIS — J343 Hypertrophy of nasal turbinates: Secondary | ICD-10-CM | POA: Diagnosis not present

## 2020-08-21 DIAGNOSIS — C44321 Squamous cell carcinoma of skin of nose: Secondary | ICD-10-CM | POA: Diagnosis not present

## 2020-09-11 DIAGNOSIS — C300 Malignant neoplasm of nasal cavity: Secondary | ICD-10-CM | POA: Diagnosis not present

## 2020-09-11 DIAGNOSIS — C44391 Other specified malignant neoplasm of skin of nose: Secondary | ICD-10-CM | POA: Diagnosis not present

## 2020-09-18 DIAGNOSIS — J342 Deviated nasal septum: Secondary | ICD-10-CM | POA: Diagnosis not present

## 2020-09-18 DIAGNOSIS — J343 Hypertrophy of nasal turbinates: Secondary | ICD-10-CM | POA: Diagnosis not present

## 2020-09-18 DIAGNOSIS — C300 Malignant neoplasm of nasal cavity: Secondary | ICD-10-CM | POA: Diagnosis not present

## 2020-09-18 DIAGNOSIS — R040 Epistaxis: Secondary | ICD-10-CM | POA: Diagnosis not present

## 2020-09-18 DIAGNOSIS — J3489 Other specified disorders of nose and nasal sinuses: Secondary | ICD-10-CM | POA: Diagnosis not present

## 2020-09-18 DIAGNOSIS — L989 Disorder of the skin and subcutaneous tissue, unspecified: Secondary | ICD-10-CM | POA: Diagnosis not present

## 2020-09-19 ENCOUNTER — Other Ambulatory Visit: Payer: Self-pay | Admitting: Neurology

## 2020-09-24 DIAGNOSIS — I10 Essential (primary) hypertension: Secondary | ICD-10-CM | POA: Diagnosis not present

## 2020-09-24 DIAGNOSIS — E782 Mixed hyperlipidemia: Secondary | ICD-10-CM | POA: Diagnosis not present

## 2020-09-24 DIAGNOSIS — E7849 Other hyperlipidemia: Secondary | ICD-10-CM | POA: Diagnosis not present

## 2020-09-24 DIAGNOSIS — E8881 Metabolic syndrome: Secondary | ICD-10-CM | POA: Diagnosis not present

## 2020-09-24 DIAGNOSIS — N183 Chronic kidney disease, stage 3 unspecified: Secondary | ICD-10-CM | POA: Diagnosis not present

## 2020-09-27 DIAGNOSIS — C44321 Squamous cell carcinoma of skin of nose: Secondary | ICD-10-CM | POA: Diagnosis not present

## 2020-09-27 DIAGNOSIS — N289 Disorder of kidney and ureter, unspecified: Secondary | ICD-10-CM | POA: Diagnosis not present

## 2020-09-27 DIAGNOSIS — E7849 Other hyperlipidemia: Secondary | ICD-10-CM | POA: Diagnosis not present

## 2020-09-27 DIAGNOSIS — N138 Other obstructive and reflux uropathy: Secondary | ICD-10-CM | POA: Diagnosis not present

## 2020-09-27 DIAGNOSIS — N045 Nephrotic syndrome with diffuse mesangiocapillary glomerulonephritis: Secondary | ICD-10-CM | POA: Diagnosis not present

## 2020-09-27 DIAGNOSIS — E871 Hypo-osmolality and hyponatremia: Secondary | ICD-10-CM | POA: Diagnosis not present

## 2020-09-27 DIAGNOSIS — R1084 Generalized abdominal pain: Secondary | ICD-10-CM | POA: Diagnosis not present

## 2020-09-27 DIAGNOSIS — N401 Enlarged prostate with lower urinary tract symptoms: Secondary | ICD-10-CM | POA: Diagnosis not present

## 2020-09-27 DIAGNOSIS — I10 Essential (primary) hypertension: Secondary | ICD-10-CM | POA: Diagnosis not present

## 2020-10-01 DIAGNOSIS — C300 Malignant neoplasm of nasal cavity: Secondary | ICD-10-CM | POA: Diagnosis not present

## 2020-10-05 DIAGNOSIS — C300 Malignant neoplasm of nasal cavity: Secondary | ICD-10-CM | POA: Diagnosis not present

## 2020-10-16 DIAGNOSIS — C300 Malignant neoplasm of nasal cavity: Secondary | ICD-10-CM | POA: Diagnosis not present

## 2020-10-16 DIAGNOSIS — Z9089 Acquired absence of other organs: Secondary | ICD-10-CM | POA: Diagnosis not present

## 2020-10-23 DIAGNOSIS — C300 Malignant neoplasm of nasal cavity: Secondary | ICD-10-CM | POA: Diagnosis not present

## 2020-11-05 DIAGNOSIS — M950 Acquired deformity of nose: Secondary | ICD-10-CM | POA: Diagnosis not present

## 2020-11-05 DIAGNOSIS — C300 Malignant neoplasm of nasal cavity: Secondary | ICD-10-CM | POA: Diagnosis not present

## 2020-11-14 DIAGNOSIS — C760 Malignant neoplasm of head, face and neck: Secondary | ICD-10-CM | POA: Diagnosis not present

## 2020-11-14 DIAGNOSIS — Z483 Aftercare following surgery for neoplasm: Secondary | ICD-10-CM | POA: Diagnosis not present

## 2020-11-21 DIAGNOSIS — C300 Malignant neoplasm of nasal cavity: Secondary | ICD-10-CM | POA: Diagnosis not present

## 2020-12-20 DIAGNOSIS — C300 Malignant neoplasm of nasal cavity: Secondary | ICD-10-CM | POA: Diagnosis not present

## 2020-12-26 DIAGNOSIS — C300 Malignant neoplasm of nasal cavity: Secondary | ICD-10-CM | POA: Diagnosis not present

## 2021-01-03 DIAGNOSIS — C300 Malignant neoplasm of nasal cavity: Secondary | ICD-10-CM | POA: Diagnosis not present

## 2021-01-04 DIAGNOSIS — C300 Malignant neoplasm of nasal cavity: Secondary | ICD-10-CM | POA: Diagnosis not present

## 2021-01-04 DIAGNOSIS — D4102 Neoplasm of uncertain behavior of left kidney: Secondary | ICD-10-CM | POA: Diagnosis not present

## 2021-01-07 DIAGNOSIS — Z452 Encounter for adjustment and management of vascular access device: Secondary | ICD-10-CM | POA: Diagnosis not present

## 2021-01-07 DIAGNOSIS — M170 Bilateral primary osteoarthritis of knee: Secondary | ICD-10-CM | POA: Diagnosis not present

## 2021-01-07 DIAGNOSIS — Z85828 Personal history of other malignant neoplasm of skin: Secondary | ICD-10-CM | POA: Diagnosis not present

## 2021-01-07 DIAGNOSIS — Z86711 Personal history of pulmonary embolism: Secondary | ICD-10-CM | POA: Diagnosis not present

## 2021-01-07 DIAGNOSIS — C300 Malignant neoplasm of nasal cavity: Secondary | ICD-10-CM | POA: Diagnosis not present

## 2021-01-07 DIAGNOSIS — E669 Obesity, unspecified: Secondary | ICD-10-CM | POA: Diagnosis not present

## 2021-01-07 DIAGNOSIS — M109 Gout, unspecified: Secondary | ICD-10-CM | POA: Diagnosis not present

## 2021-01-07 DIAGNOSIS — G629 Polyneuropathy, unspecified: Secondary | ICD-10-CM | POA: Diagnosis not present

## 2021-01-07 DIAGNOSIS — I10 Essential (primary) hypertension: Secondary | ICD-10-CM | POA: Diagnosis not present

## 2021-01-08 DIAGNOSIS — Z95828 Presence of other vascular implants and grafts: Secondary | ICD-10-CM | POA: Diagnosis not present

## 2021-01-08 DIAGNOSIS — C300 Malignant neoplasm of nasal cavity: Secondary | ICD-10-CM | POA: Diagnosis not present

## 2021-01-08 DIAGNOSIS — Z5111 Encounter for antineoplastic chemotherapy: Secondary | ICD-10-CM | POA: Diagnosis not present

## 2021-01-09 DIAGNOSIS — C300 Malignant neoplasm of nasal cavity: Secondary | ICD-10-CM | POA: Diagnosis not present

## 2021-01-10 DIAGNOSIS — C300 Malignant neoplasm of nasal cavity: Secondary | ICD-10-CM | POA: Diagnosis not present

## 2021-01-11 DIAGNOSIS — C300 Malignant neoplasm of nasal cavity: Secondary | ICD-10-CM | POA: Diagnosis not present

## 2021-01-14 ENCOUNTER — Encounter (INDEPENDENT_AMBULATORY_CARE_PROVIDER_SITE_OTHER): Payer: Medicare PPO | Admitting: Ophthalmology

## 2021-01-14 DIAGNOSIS — M5136 Other intervertebral disc degeneration, lumbar region: Secondary | ICD-10-CM | POA: Diagnosis not present

## 2021-01-14 DIAGNOSIS — C300 Malignant neoplasm of nasal cavity: Secondary | ICD-10-CM | POA: Diagnosis not present

## 2021-01-14 DIAGNOSIS — D4102 Neoplasm of uncertain behavior of left kidney: Secondary | ICD-10-CM | POA: Diagnosis not present

## 2021-01-15 DIAGNOSIS — Z95828 Presence of other vascular implants and grafts: Secondary | ICD-10-CM | POA: Diagnosis not present

## 2021-01-15 DIAGNOSIS — Z5111 Encounter for antineoplastic chemotherapy: Secondary | ICD-10-CM | POA: Diagnosis not present

## 2021-01-15 DIAGNOSIS — C300 Malignant neoplasm of nasal cavity: Secondary | ICD-10-CM | POA: Diagnosis not present

## 2021-01-16 DIAGNOSIS — C300 Malignant neoplasm of nasal cavity: Secondary | ICD-10-CM | POA: Diagnosis not present

## 2021-01-17 DIAGNOSIS — C300 Malignant neoplasm of nasal cavity: Secondary | ICD-10-CM | POA: Diagnosis not present

## 2021-01-18 DIAGNOSIS — C300 Malignant neoplasm of nasal cavity: Secondary | ICD-10-CM | POA: Diagnosis not present

## 2021-01-22 DIAGNOSIS — C300 Malignant neoplasm of nasal cavity: Secondary | ICD-10-CM | POA: Diagnosis not present

## 2021-01-22 DIAGNOSIS — Z5111 Encounter for antineoplastic chemotherapy: Secondary | ICD-10-CM | POA: Diagnosis not present

## 2021-01-23 DIAGNOSIS — C300 Malignant neoplasm of nasal cavity: Secondary | ICD-10-CM | POA: Diagnosis not present

## 2021-01-24 DIAGNOSIS — C300 Malignant neoplasm of nasal cavity: Secondary | ICD-10-CM | POA: Diagnosis not present

## 2021-01-25 DIAGNOSIS — C300 Malignant neoplasm of nasal cavity: Secondary | ICD-10-CM | POA: Diagnosis not present

## 2021-01-28 DIAGNOSIS — C300 Malignant neoplasm of nasal cavity: Secondary | ICD-10-CM | POA: Diagnosis not present

## 2021-01-29 DIAGNOSIS — C300 Malignant neoplasm of nasal cavity: Secondary | ICD-10-CM | POA: Diagnosis not present

## 2021-01-29 DIAGNOSIS — Z95828 Presence of other vascular implants and grafts: Secondary | ICD-10-CM | POA: Diagnosis not present

## 2021-01-29 DIAGNOSIS — Z5111 Encounter for antineoplastic chemotherapy: Secondary | ICD-10-CM | POA: Diagnosis not present

## 2021-01-30 DIAGNOSIS — C300 Malignant neoplasm of nasal cavity: Secondary | ICD-10-CM | POA: Diagnosis not present

## 2021-01-31 DIAGNOSIS — C300 Malignant neoplasm of nasal cavity: Secondary | ICD-10-CM | POA: Diagnosis not present

## 2021-02-01 DIAGNOSIS — C300 Malignant neoplasm of nasal cavity: Secondary | ICD-10-CM | POA: Diagnosis not present

## 2021-02-04 DIAGNOSIS — Z1329 Encounter for screening for other suspected endocrine disorder: Secondary | ICD-10-CM | POA: Diagnosis not present

## 2021-02-04 DIAGNOSIS — D4102 Neoplasm of uncertain behavior of left kidney: Secondary | ICD-10-CM | POA: Diagnosis not present

## 2021-02-04 DIAGNOSIS — C300 Malignant neoplasm of nasal cavity: Secondary | ICD-10-CM | POA: Diagnosis not present

## 2021-02-05 DIAGNOSIS — C300 Malignant neoplasm of nasal cavity: Secondary | ICD-10-CM | POA: Diagnosis not present

## 2021-02-06 DIAGNOSIS — C300 Malignant neoplasm of nasal cavity: Secondary | ICD-10-CM | POA: Diagnosis not present

## 2021-02-07 DIAGNOSIS — C300 Malignant neoplasm of nasal cavity: Secondary | ICD-10-CM | POA: Diagnosis not present

## 2021-02-08 DIAGNOSIS — C300 Malignant neoplasm of nasal cavity: Secondary | ICD-10-CM | POA: Diagnosis not present

## 2021-02-11 DIAGNOSIS — Z86711 Personal history of pulmonary embolism: Secondary | ICD-10-CM | POA: Diagnosis not present

## 2021-02-11 DIAGNOSIS — G629 Polyneuropathy, unspecified: Secondary | ICD-10-CM | POA: Diagnosis not present

## 2021-02-11 DIAGNOSIS — C300 Malignant neoplasm of nasal cavity: Secondary | ICD-10-CM | POA: Diagnosis not present

## 2021-02-11 DIAGNOSIS — E871 Hypo-osmolality and hyponatremia: Secondary | ICD-10-CM | POA: Diagnosis not present

## 2021-02-12 DIAGNOSIS — C300 Malignant neoplasm of nasal cavity: Secondary | ICD-10-CM | POA: Diagnosis not present

## 2021-02-12 DIAGNOSIS — Z5111 Encounter for antineoplastic chemotherapy: Secondary | ICD-10-CM | POA: Diagnosis not present

## 2021-02-12 DIAGNOSIS — Z95828 Presence of other vascular implants and grafts: Secondary | ICD-10-CM | POA: Diagnosis not present

## 2021-02-13 DIAGNOSIS — C300 Malignant neoplasm of nasal cavity: Secondary | ICD-10-CM | POA: Diagnosis not present

## 2021-02-14 DIAGNOSIS — C300 Malignant neoplasm of nasal cavity: Secondary | ICD-10-CM | POA: Diagnosis not present

## 2021-02-15 DIAGNOSIS — C300 Malignant neoplasm of nasal cavity: Secondary | ICD-10-CM | POA: Diagnosis not present

## 2021-02-18 DIAGNOSIS — E871 Hypo-osmolality and hyponatremia: Secondary | ICD-10-CM | POA: Diagnosis not present

## 2021-02-18 DIAGNOSIS — D4102 Neoplasm of uncertain behavior of left kidney: Secondary | ICD-10-CM | POA: Diagnosis not present

## 2021-02-18 DIAGNOSIS — G629 Polyneuropathy, unspecified: Secondary | ICD-10-CM | POA: Diagnosis not present

## 2021-02-18 DIAGNOSIS — I10 Essential (primary) hypertension: Secondary | ICD-10-CM | POA: Diagnosis not present

## 2021-02-18 DIAGNOSIS — C300 Malignant neoplasm of nasal cavity: Secondary | ICD-10-CM | POA: Diagnosis not present

## 2021-02-19 DIAGNOSIS — I10 Essential (primary) hypertension: Secondary | ICD-10-CM | POA: Diagnosis not present

## 2021-02-19 DIAGNOSIS — E871 Hypo-osmolality and hyponatremia: Secondary | ICD-10-CM | POA: Diagnosis not present

## 2021-02-19 DIAGNOSIS — G629 Polyneuropathy, unspecified: Secondary | ICD-10-CM | POA: Diagnosis not present

## 2021-02-19 DIAGNOSIS — D4102 Neoplasm of uncertain behavior of left kidney: Secondary | ICD-10-CM | POA: Diagnosis not present

## 2021-02-19 DIAGNOSIS — Z5111 Encounter for antineoplastic chemotherapy: Secondary | ICD-10-CM | POA: Diagnosis not present

## 2021-02-19 DIAGNOSIS — Z95828 Presence of other vascular implants and grafts: Secondary | ICD-10-CM | POA: Diagnosis not present

## 2021-02-19 DIAGNOSIS — C300 Malignant neoplasm of nasal cavity: Secondary | ICD-10-CM | POA: Diagnosis not present

## 2021-02-20 DIAGNOSIS — C300 Malignant neoplasm of nasal cavity: Secondary | ICD-10-CM | POA: Diagnosis not present

## 2021-02-26 DIAGNOSIS — G629 Polyneuropathy, unspecified: Secondary | ICD-10-CM | POA: Diagnosis not present

## 2021-02-26 DIAGNOSIS — C300 Malignant neoplasm of nasal cavity: Secondary | ICD-10-CM | POA: Diagnosis not present

## 2021-02-26 DIAGNOSIS — E871 Hypo-osmolality and hyponatremia: Secondary | ICD-10-CM | POA: Diagnosis not present

## 2021-02-26 DIAGNOSIS — D4102 Neoplasm of uncertain behavior of left kidney: Secondary | ICD-10-CM | POA: Diagnosis not present

## 2021-02-26 DIAGNOSIS — Z1329 Encounter for screening for other suspected endocrine disorder: Secondary | ICD-10-CM | POA: Diagnosis not present

## 2021-02-26 DIAGNOSIS — I10 Essential (primary) hypertension: Secondary | ICD-10-CM | POA: Diagnosis not present

## 2021-02-28 DIAGNOSIS — C300 Malignant neoplasm of nasal cavity: Secondary | ICD-10-CM | POA: Diagnosis not present

## 2021-03-07 DIAGNOSIS — E871 Hypo-osmolality and hyponatremia: Secondary | ICD-10-CM | POA: Diagnosis not present

## 2021-03-07 DIAGNOSIS — D4102 Neoplasm of uncertain behavior of left kidney: Secondary | ICD-10-CM | POA: Diagnosis not present

## 2021-03-07 DIAGNOSIS — G629 Polyneuropathy, unspecified: Secondary | ICD-10-CM | POA: Diagnosis not present

## 2021-03-07 DIAGNOSIS — C300 Malignant neoplasm of nasal cavity: Secondary | ICD-10-CM | POA: Diagnosis not present

## 2021-03-07 DIAGNOSIS — I10 Essential (primary) hypertension: Secondary | ICD-10-CM | POA: Diagnosis not present

## 2021-03-11 DIAGNOSIS — L239 Allergic contact dermatitis, unspecified cause: Secondary | ICD-10-CM | POA: Diagnosis not present

## 2021-03-12 DIAGNOSIS — Z85828 Personal history of other malignant neoplasm of skin: Secondary | ICD-10-CM | POA: Diagnosis not present

## 2021-03-12 DIAGNOSIS — C300 Malignant neoplasm of nasal cavity: Secondary | ICD-10-CM | POA: Diagnosis not present

## 2021-03-12 DIAGNOSIS — Z08 Encounter for follow-up examination after completed treatment for malignant neoplasm: Secondary | ICD-10-CM | POA: Diagnosis not present

## 2021-03-26 DIAGNOSIS — C300 Malignant neoplasm of nasal cavity: Secondary | ICD-10-CM | POA: Diagnosis not present

## 2021-03-26 DIAGNOSIS — Z1329 Encounter for screening for other suspected endocrine disorder: Secondary | ICD-10-CM | POA: Diagnosis not present

## 2021-03-26 DIAGNOSIS — Z79899 Other long term (current) drug therapy: Secondary | ICD-10-CM | POA: Diagnosis not present

## 2021-03-26 DIAGNOSIS — D4102 Neoplasm of uncertain behavior of left kidney: Secondary | ICD-10-CM | POA: Diagnosis not present

## 2021-03-29 DIAGNOSIS — E782 Mixed hyperlipidemia: Secondary | ICD-10-CM | POA: Diagnosis not present

## 2021-03-29 DIAGNOSIS — R7301 Impaired fasting glucose: Secondary | ICD-10-CM | POA: Diagnosis not present

## 2021-03-29 DIAGNOSIS — E7849 Other hyperlipidemia: Secondary | ICD-10-CM | POA: Diagnosis not present

## 2021-03-29 DIAGNOSIS — N183 Chronic kidney disease, stage 3 unspecified: Secondary | ICD-10-CM | POA: Diagnosis not present

## 2021-04-03 DIAGNOSIS — E039 Hypothyroidism, unspecified: Secondary | ICD-10-CM | POA: Diagnosis not present

## 2021-04-03 DIAGNOSIS — N401 Enlarged prostate with lower urinary tract symptoms: Secondary | ICD-10-CM | POA: Diagnosis not present

## 2021-04-03 DIAGNOSIS — Z23 Encounter for immunization: Secondary | ICD-10-CM | POA: Diagnosis not present

## 2021-04-03 DIAGNOSIS — I10 Essential (primary) hypertension: Secondary | ICD-10-CM | POA: Diagnosis not present

## 2021-04-03 DIAGNOSIS — E7849 Other hyperlipidemia: Secondary | ICD-10-CM | POA: Diagnosis not present

## 2021-04-03 DIAGNOSIS — E871 Hypo-osmolality and hyponatremia: Secondary | ICD-10-CM | POA: Diagnosis not present

## 2021-04-03 DIAGNOSIS — C44321 Squamous cell carcinoma of skin of nose: Secondary | ICD-10-CM | POA: Diagnosis not present

## 2021-04-03 DIAGNOSIS — R1084 Generalized abdominal pain: Secondary | ICD-10-CM | POA: Diagnosis not present

## 2021-04-16 ENCOUNTER — Other Ambulatory Visit: Payer: Self-pay

## 2021-04-16 ENCOUNTER — Encounter (INDEPENDENT_AMBULATORY_CARE_PROVIDER_SITE_OTHER): Payer: Self-pay | Admitting: Ophthalmology

## 2021-04-16 ENCOUNTER — Encounter (INDEPENDENT_AMBULATORY_CARE_PROVIDER_SITE_OTHER): Payer: Medicare PPO | Admitting: Ophthalmology

## 2021-04-16 ENCOUNTER — Ambulatory Visit (INDEPENDENT_AMBULATORY_CARE_PROVIDER_SITE_OTHER): Payer: Medicare PPO | Admitting: Ophthalmology

## 2021-04-16 DIAGNOSIS — H2512 Age-related nuclear cataract, left eye: Secondary | ICD-10-CM | POA: Diagnosis not present

## 2021-04-16 DIAGNOSIS — H26491 Other secondary cataract, right eye: Secondary | ICD-10-CM

## 2021-04-16 DIAGNOSIS — H35373 Puckering of macula, bilateral: Secondary | ICD-10-CM

## 2021-04-16 DIAGNOSIS — H35371 Puckering of macula, right eye: Secondary | ICD-10-CM | POA: Diagnosis not present

## 2021-04-16 DIAGNOSIS — H35372 Puckering of macula, left eye: Secondary | ICD-10-CM | POA: Diagnosis not present

## 2021-04-16 NOTE — Progress Notes (Signed)
04/16/2021     CHIEF COMPLAINT Patient presents for  Chief Complaint  Patient presents with   Retina Follow Up      HISTORY OF PRESENT ILLNESS: Mike Davis is a 80 y.o. male who presents to the clinic today for:   HPI     Retina Follow Up   Patient presents with  Other.  In both eyes.  This started 1 year ago.  Severity is mild.  Duration of 1 year.  Since onset it is stable.        Comments   1 year fu OU and OCT Pt states VA OU stable since last visit. Pt denies FOL, floaters, or ocular pain OU.  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 I was told I could develop a cataract from it. I cannot tell any real change in my vision though."       Last edited by Kendra Opitz, COA on 04/16/2021  1:18 PM.      Referring physician: Warden Fillers, MD [address] [place],  [place] [address]  HISTORICAL INFORMATION:   Selected notes from the MEDICAL RECORD NUMBER    Lab Results  Component Value Date   HGBA1C 5.6 09/16/2016     CURRENT MEDICATIONS: No current outpatient medications on file. (Ophthalmic Drugs)   No current facility-administered medications for this visit. (Ophthalmic Drugs)   Current Outpatient Medications (Other)  Medication Sig   acetaminophen (TYLENOL) 325 MG tablet Take 650 mg by mouth every 6 (six) hours as needed (pain).   ascorbic acid (VITAMIN C) 500 MG tablet Take 500 mg by mouth daily.   calcium-vitamin D (OSCAL WITH D) 250-125 MG-UNIT tablet Take 1 tablet by mouth 2 (two) times daily.   cholecalciferol (VITAMIN D) 1000 units tablet Take 1,000 Units by mouth daily.   folic acid (FOLVITE) 063 MCG tablet Take 400 mcg by mouth daily.   gabapentin (NEURONTIN) 300 MG capsule TAKE 2 CAPSULES (600 MG TOTAL) BY MOUTH 2 (TWO) TIMES DAILY.   ibuprofen (ADVIL,MOTRIN) 200 MG tablet Take 600 mg by mouth every 6 (six) hours as needed (for pain with  yardwork).   losartan (COZAAR) 25 MG tablet TAKE 1 TABLET BY MOUTH EVERY MORNING FOR BLOOD PRESSURE   meloxicam (MOBIC) 15 MG tablet Take 15 mg by mouth daily.   metoprolol succinate (TOPROL-XL) 50 MG 24 hr tablet Take 50 mg by mouth daily. Take with or immediately following a meal.   Multiple Vitamins-Minerals (MULTIVITAMIN WITH MINERALS) tablet Take 1 tablet by mouth daily.   Oxcarbazepine (TRILEPTAL) 300 MG tablet Take 1 tablet three times daily.   vitamin B-12 (CYANOCOBALAMIN) 500 MCG tablet Take 500 mcg by mouth daily.   zinc gluconate 50 MG tablet Take 50 mg by mouth daily.   No current facility-administered medications for this visit. (Other)      REVIEW OF SYSTEMS:    ALLERGIES Allergies  Allergen Reactions   Lamictal [Lamotrigine] Rash    PAST MEDICAL HISTORY Past Medical History:  Diagnosis Date   CHF (congestive heart failure) ([place])    DVT (deep venous thrombosis) ([place])    Eye cancer ([place]) 2005   right eye   Hypertension    Macular pucker, right    Pulmonary emboli ([facility])    Shortness of breath dyspnea    Past Surgical History:  Procedure Laterality Date   CATARACT EXTRACTION Right  LUMBAR SPINE SURGERY      FAMILY HISTORY Family History  Problem Relation Age of Onset   Brain cancer Mother    Stroke Father    Leukemia Brother     SOCIAL HISTORY Social History   Tobacco Use   Smoking status: Never   Smokeless tobacco: Never  Substance Use Topics   Alcohol use: No   Drug use: No         OPHTHALMIC EXAM:  Base Eye Exam     Visual Acuity (ETDRS)       Right Left   Dist cc 20/30 -1 20/25 -2   Dist ph cc NI          Tonometry (Tonopen, 1:22 PM)       Right Left   Pressure 14 14         Pupils       Pupils Dark Light Shape React APD   Right PERRL 4 3 Round Brisk None   Left PERRL 4 3 Round Brisk None         Visual Fields (Counting fingers)       Left Right    Full Full         Extraocular Movement        Right Left    Full Full         Neuro/Psych     Oriented x3: Yes   Mood/Affect: Normal         Dilation     Both eyes: 1.0% Mydriacyl, 2.5% Phenylephrine @ 1:22 PM           Slit Lamp and Fundus Exam     External Exam       Right Left   External Normal Normal         Slit Lamp Exam       Right Left   Lids/Lashes Normal Normal   Conjunctiva/Sclera White and quiet White and quiet   Cornea Clear Clear   Anterior Chamber Deep and quiet Deep and quiet   [place] Round and reactive Round and reactive   Lens Centered posterior chamber intraocular lens 3+ Nuclear sclerosis   Anterior Vitreous Normal Normal         Fundus Exam       Right Left   Posterior Vitreous Vitrectomized Posterior vitreous detachment   Disc Normal Normal   C/D Ratio 0.25 0.25   Macula Normal Epiretinal membrane mild topo distortion   Vessels Normal Normal   Periphery Normal Normal            IMAGING AND PROCEDURES  Imaging and Procedures for 04/16/21  OCT, Retina - OU - Both Eyes       Right Eye Quality was good. Scan locations included subfoveal. Central Foveal Thickness: 375. Progression has been stable. Findings include epiretinal membrane.   Left Eye Quality was good. Scan locations included subfoveal. Central Foveal Thickness: 349. Progression has been stable. Findings include epiretinal membrane.   Notes OD, residual thickening from prior epiretinal membrane, minor nasal to FAZ will observe Minor ILM change nasal to the fovea, not visually significant    OS, epiretinal membrane with ILM distorting the underlying fovea, minor will observe stable over time             ASSESSMENT/PLAN:  Posterior capsular opacification non visually significant of right eye Minor central PC opacity follow-up Dr. 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  I do recommend cataract extraction with lens placement in the left eye first and if acuity improved substantially to a satisfactory level, observation would then be appropriate for the ERM      ICD-10-CM   1. Left epiretinal membrane  H35.372 OCT, Retina - OU - Both Eyes    2. Right epiretinal membrane  H35.371 OCT, Retina - OU - Both Eyes    3. Posterior capsular opacification non visually significant of right eye  H26.491     4. Nuclear sclerotic cataract of left eye  H25.12       1.  OS with epiretinal membrane distorting the fovea yet with significant nuclear sclerotic cataract present.  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  2.  OD looks great, pseudophakic with only minor posterior capsule opacification and no foveal distortion from ILM remnant  3.  Ophthalmic Meds Ordered this visit:  No orders of the defined types were placed in this encounter.      Return in about 1 year (around 04/16/2022) for DILATE OU, OCT.  There are no Patient Instructions on file for this visit.   Explained the diagnoses, plan, and follow up with the patient and they expressed understanding.  Patient expressed understanding of the importance of proper follow up care.   Clent Demark Gaylin Bulthuis M.D. Diseases & Surgery of the Retina and Vitreous Retina & Diabetic [place] 04/16/21     Abbreviations: M myopia (nearsighted); A astigmatism; H hyperopia (farsighted); P presbyopia; Mrx spectacle prescription;  CTL contact lenses; OD right eye; OS left eye; OU both eyes  XT exotropia; ET esotropia; PEK punctate epithelial keratitis; PEE punctate epithelial erosions; DES dry eye syndrome; MGD meibomian gland dysfunction; ATs artificial tears; PFAT's preservative free  artificial tears; [place] nuclear sclerotic cataract; PSC posterior subcapsular cataract; ERM epi-retinal membrane; PVD posterior vitreous detachment; RD retinal detachment; DM diabetes mellitus; DR diabetic retinopathy; NPDR non-proliferative diabetic retinopathy; PDR proliferative diabetic retinopathy; CSME clinically significant macular edema; DME diabetic macular edema; dbh dot blot hemorrhages; CWS cotton wool spot; POAG primary open angle glaucoma; C/D cup-to-disc ratio; HVF humphrey visual field; GVF goldmann visual field; OCT optical coherence tomography; IOP intraocular pressure; BRVO Branch retinal vein occlusion; CRVO central retinal vein occlusion; CRAO central retinal artery occlusion; BRAO branch retinal artery occlusion; RT retinal tear; SB scleral buckle; PPV pars plana vitrectomy; VH Vitreous hemorrhage; PRP panretinal laser photocoagulation; IVK intravitreal kenalog; VMT vitreomacular traction; MH Macular hole;  NVD neovascularization of the disc; NVE neovascularization elsewhere; AREDS age related eye disease study; ARMD age related macular degeneration; POAG primary open angle glaucoma; EBMD epithelial/anterior basement membrane dystrophy; ACIOL anterior chamber intraocular lens; IOL intraocular lens; PCIOL posterior chamber intraocular lens; Phaco/IOL phacoemulsification with intraocular lens placement; [place] photorefractive keratectomy; LASIK laser assisted in situ keratomileusis; HTN hypertension; DM diabetes mellitus; COPD chronic obstructive pulmonary disease

## 2021-04-16 NOTE — Assessment & Plan Note (Signed)
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

## 2021-04-16 NOTE — Assessment & Plan Note (Signed)
Moderate epiretinal membrane with foveal distortion yet significant cataract present.  I do recommend cataract extraction with lens placement in the left eye first and if acuity improved substantially to a satisfactory level, observation would then be appropriate for the ERM

## 2021-04-16 NOTE — Assessment & Plan Note (Signed)
Minor central PC opacity follow-up Dr. Richardson Chiquito as scheduled

## 2021-05-10 DIAGNOSIS — Z23 Encounter for immunization: Secondary | ICD-10-CM | POA: Diagnosis not present

## 2021-05-13 DIAGNOSIS — C300 Malignant neoplasm of nasal cavity: Secondary | ICD-10-CM | POA: Diagnosis not present

## 2021-05-21 DIAGNOSIS — C300 Malignant neoplasm of nasal cavity: Secondary | ICD-10-CM | POA: Diagnosis not present

## 2021-05-22 DIAGNOSIS — D044 Carcinoma in situ of skin of scalp and neck: Secondary | ICD-10-CM | POA: Diagnosis not present

## 2021-05-22 DIAGNOSIS — B078 Other viral warts: Secondary | ICD-10-CM | POA: Diagnosis not present

## 2021-05-22 DIAGNOSIS — D0439 Carcinoma in situ of skin of other parts of face: Secondary | ICD-10-CM | POA: Diagnosis not present

## 2021-05-22 DIAGNOSIS — C44329 Squamous cell carcinoma of skin of other parts of face: Secondary | ICD-10-CM | POA: Diagnosis not present

## 2021-05-22 DIAGNOSIS — D485 Neoplasm of uncertain behavior of skin: Secondary | ICD-10-CM | POA: Diagnosis not present

## 2021-05-22 DIAGNOSIS — C4442 Squamous cell carcinoma of skin of scalp and neck: Secondary | ICD-10-CM | POA: Diagnosis not present

## 2021-05-22 DIAGNOSIS — L570 Actinic keratosis: Secondary | ICD-10-CM | POA: Diagnosis not present

## 2021-05-31 DIAGNOSIS — C300 Malignant neoplasm of nasal cavity: Secondary | ICD-10-CM | POA: Diagnosis not present

## 2021-05-31 DIAGNOSIS — M47812 Spondylosis without myelopathy or radiculopathy, cervical region: Secondary | ICD-10-CM | POA: Diagnosis not present

## 2021-05-31 DIAGNOSIS — R221 Localized swelling, mass and lump, neck: Secondary | ICD-10-CM | POA: Diagnosis not present

## 2021-05-31 DIAGNOSIS — Z9009 Acquired absence of other part of head and neck: Secondary | ICD-10-CM | POA: Diagnosis not present

## 2021-06-06 DIAGNOSIS — C44329 Squamous cell carcinoma of skin of other parts of face: Secondary | ICD-10-CM | POA: Diagnosis not present

## 2021-06-06 DIAGNOSIS — C4442 Squamous cell carcinoma of skin of scalp and neck: Secondary | ICD-10-CM | POA: Diagnosis not present

## 2021-06-07 DIAGNOSIS — C300 Malignant neoplasm of nasal cavity: Secondary | ICD-10-CM | POA: Diagnosis not present

## 2021-06-10 DIAGNOSIS — I10 Essential (primary) hypertension: Secondary | ICD-10-CM | POA: Diagnosis not present

## 2021-06-10 DIAGNOSIS — N183 Chronic kidney disease, stage 3 unspecified: Secondary | ICD-10-CM | POA: Diagnosis not present

## 2021-06-10 DIAGNOSIS — Z0001 Encounter for general adult medical examination with abnormal findings: Secondary | ICD-10-CM | POA: Diagnosis not present

## 2021-06-10 DIAGNOSIS — E7849 Other hyperlipidemia: Secondary | ICD-10-CM | POA: Diagnosis not present

## 2021-06-10 DIAGNOSIS — E039 Hypothyroidism, unspecified: Secondary | ICD-10-CM | POA: Diagnosis not present

## 2021-06-10 DIAGNOSIS — E8881 Metabolic syndrome: Secondary | ICD-10-CM | POA: Diagnosis not present

## 2021-06-10 DIAGNOSIS — Z6833 Body mass index (BMI) 33.0-33.9, adult: Secondary | ICD-10-CM | POA: Diagnosis not present

## 2021-06-18 DIAGNOSIS — C300 Malignant neoplasm of nasal cavity: Secondary | ICD-10-CM | POA: Diagnosis not present

## 2021-07-25 DIAGNOSIS — C4442 Squamous cell carcinoma of skin of scalp and neck: Secondary | ICD-10-CM | POA: Diagnosis not present

## 2021-07-25 DIAGNOSIS — C44329 Squamous cell carcinoma of skin of other parts of face: Secondary | ICD-10-CM | POA: Diagnosis not present

## 2021-07-30 DIAGNOSIS — E7849 Other hyperlipidemia: Secondary | ICD-10-CM | POA: Diagnosis not present

## 2021-07-30 DIAGNOSIS — N183 Chronic kidney disease, stage 3 unspecified: Secondary | ICD-10-CM | POA: Diagnosis not present

## 2021-07-30 DIAGNOSIS — I10 Essential (primary) hypertension: Secondary | ICD-10-CM | POA: Diagnosis not present

## 2021-07-30 DIAGNOSIS — E039 Hypothyroidism, unspecified: Secondary | ICD-10-CM | POA: Diagnosis not present

## 2021-07-30 DIAGNOSIS — E871 Hypo-osmolality and hyponatremia: Secondary | ICD-10-CM | POA: Diagnosis not present

## 2021-07-30 DIAGNOSIS — R7301 Impaired fasting glucose: Secondary | ICD-10-CM | POA: Diagnosis not present

## 2021-07-30 DIAGNOSIS — E782 Mixed hyperlipidemia: Secondary | ICD-10-CM | POA: Diagnosis not present

## 2021-07-30 DIAGNOSIS — E8881 Metabolic syndrome: Secondary | ICD-10-CM | POA: Diagnosis not present

## 2021-08-01 DIAGNOSIS — N401 Enlarged prostate with lower urinary tract symptoms: Secondary | ICD-10-CM | POA: Diagnosis not present

## 2021-08-01 DIAGNOSIS — E871 Hypo-osmolality and hyponatremia: Secondary | ICD-10-CM | POA: Diagnosis not present

## 2021-08-01 DIAGNOSIS — R7301 Impaired fasting glucose: Secondary | ICD-10-CM | POA: Diagnosis not present

## 2021-08-01 DIAGNOSIS — E7849 Other hyperlipidemia: Secondary | ICD-10-CM | POA: Diagnosis not present

## 2021-08-01 DIAGNOSIS — G6289 Other specified polyneuropathies: Secondary | ICD-10-CM | POA: Diagnosis not present

## 2021-08-01 DIAGNOSIS — I10 Essential (primary) hypertension: Secondary | ICD-10-CM | POA: Diagnosis not present

## 2021-08-01 DIAGNOSIS — C44321 Squamous cell carcinoma of skin of nose: Secondary | ICD-10-CM | POA: Diagnosis not present

## 2021-08-01 DIAGNOSIS — E039 Hypothyroidism, unspecified: Secondary | ICD-10-CM | POA: Diagnosis not present

## 2021-08-07 DIAGNOSIS — Z923 Personal history of irradiation: Secondary | ICD-10-CM | POA: Diagnosis not present

## 2021-08-07 DIAGNOSIS — C300 Malignant neoplasm of nasal cavity: Secondary | ICD-10-CM | POA: Diagnosis not present

## 2021-08-07 DIAGNOSIS — E039 Hypothyroidism, unspecified: Secondary | ICD-10-CM | POA: Diagnosis not present

## 2021-08-19 DIAGNOSIS — H43812 Vitreous degeneration, left eye: Secondary | ICD-10-CM | POA: Diagnosis not present

## 2021-08-19 DIAGNOSIS — Z961 Presence of intraocular lens: Secondary | ICD-10-CM | POA: Diagnosis not present

## 2021-08-19 DIAGNOSIS — H26491 Other secondary cataract, right eye: Secondary | ICD-10-CM | POA: Diagnosis not present

## 2021-08-19 DIAGNOSIS — H35372 Puckering of macula, left eye: Secondary | ICD-10-CM | POA: Diagnosis not present

## 2021-08-19 DIAGNOSIS — H2512 Age-related nuclear cataract, left eye: Secondary | ICD-10-CM | POA: Diagnosis not present

## 2021-09-10 DIAGNOSIS — Z923 Personal history of irradiation: Secondary | ICD-10-CM | POA: Diagnosis not present

## 2021-09-10 DIAGNOSIS — I10 Essential (primary) hypertension: Secondary | ICD-10-CM | POA: Diagnosis not present

## 2021-09-10 DIAGNOSIS — Z6831 Body mass index (BMI) 31.0-31.9, adult: Secondary | ICD-10-CM | POA: Diagnosis not present

## 2021-09-10 DIAGNOSIS — E039 Hypothyroidism, unspecified: Secondary | ICD-10-CM | POA: Diagnosis not present

## 2021-09-10 DIAGNOSIS — Z791 Long term (current) use of non-steroidal anti-inflammatories (NSAID): Secondary | ICD-10-CM | POA: Diagnosis not present

## 2021-09-10 DIAGNOSIS — E669 Obesity, unspecified: Secondary | ICD-10-CM | POA: Diagnosis not present

## 2021-09-10 DIAGNOSIS — C300 Malignant neoplasm of nasal cavity: Secondary | ICD-10-CM | POA: Diagnosis not present

## 2021-11-05 DIAGNOSIS — C300 Malignant neoplasm of nasal cavity: Secondary | ICD-10-CM | POA: Diagnosis not present

## 2021-11-08 DIAGNOSIS — Z95828 Presence of other vascular implants and grafts: Secondary | ICD-10-CM | POA: Diagnosis not present

## 2021-11-08 DIAGNOSIS — Z09 Encounter for follow-up examination after completed treatment for conditions other than malignant neoplasm: Secondary | ICD-10-CM | POA: Diagnosis not present

## 2021-11-20 DIAGNOSIS — L570 Actinic keratosis: Secondary | ICD-10-CM | POA: Diagnosis not present

## 2021-11-20 DIAGNOSIS — C4442 Squamous cell carcinoma of skin of scalp and neck: Secondary | ICD-10-CM | POA: Diagnosis not present

## 2021-11-20 DIAGNOSIS — D044 Carcinoma in situ of skin of scalp and neck: Secondary | ICD-10-CM | POA: Diagnosis not present

## 2021-11-20 DIAGNOSIS — C44329 Squamous cell carcinoma of skin of other parts of face: Secondary | ICD-10-CM | POA: Diagnosis not present

## 2021-11-20 DIAGNOSIS — D0439 Carcinoma in situ of skin of other parts of face: Secondary | ICD-10-CM | POA: Diagnosis not present

## 2021-11-20 DIAGNOSIS — D485 Neoplasm of uncertain behavior of skin: Secondary | ICD-10-CM | POA: Diagnosis not present

## 2021-11-20 DIAGNOSIS — L430 Hypertrophic lichen planus: Secondary | ICD-10-CM | POA: Diagnosis not present

## 2021-11-26 DIAGNOSIS — E039 Hypothyroidism, unspecified: Secondary | ICD-10-CM | POA: Diagnosis not present

## 2021-11-26 DIAGNOSIS — C300 Malignant neoplasm of nasal cavity: Secondary | ICD-10-CM | POA: Diagnosis not present

## 2021-12-03 DIAGNOSIS — R432 Parageusia: Secondary | ICD-10-CM | POA: Diagnosis not present

## 2021-12-03 DIAGNOSIS — G629 Polyneuropathy, unspecified: Secondary | ICD-10-CM | POA: Diagnosis not present

## 2021-12-03 DIAGNOSIS — C300 Malignant neoplasm of nasal cavity: Secondary | ICD-10-CM | POA: Diagnosis not present

## 2021-12-03 DIAGNOSIS — K110 Atrophy of salivary gland: Secondary | ICD-10-CM | POA: Diagnosis not present

## 2021-12-03 DIAGNOSIS — E871 Hypo-osmolality and hyponatremia: Secondary | ICD-10-CM | POA: Diagnosis not present

## 2021-12-03 DIAGNOSIS — J3489 Other specified disorders of nose and nasal sinuses: Secondary | ICD-10-CM | POA: Diagnosis not present

## 2021-12-05 DIAGNOSIS — C44329 Squamous cell carcinoma of skin of other parts of face: Secondary | ICD-10-CM | POA: Diagnosis not present

## 2021-12-05 DIAGNOSIS — C4442 Squamous cell carcinoma of skin of scalp and neck: Secondary | ICD-10-CM | POA: Diagnosis not present

## 2021-12-19 DIAGNOSIS — C4442 Squamous cell carcinoma of skin of scalp and neck: Secondary | ICD-10-CM | POA: Diagnosis not present

## 2022-01-10 DIAGNOSIS — Z95828 Presence of other vascular implants and grafts: Secondary | ICD-10-CM | POA: Diagnosis not present

## 2022-01-17 DIAGNOSIS — E782 Mixed hyperlipidemia: Secondary | ICD-10-CM | POA: Diagnosis not present

## 2022-01-17 DIAGNOSIS — E039 Hypothyroidism, unspecified: Secondary | ICD-10-CM | POA: Diagnosis not present

## 2022-01-17 DIAGNOSIS — N183 Chronic kidney disease, stage 3 unspecified: Secondary | ICD-10-CM | POA: Diagnosis not present

## 2022-01-30 DIAGNOSIS — N183 Chronic kidney disease, stage 3 unspecified: Secondary | ICD-10-CM | POA: Diagnosis not present

## 2022-01-30 DIAGNOSIS — R7301 Impaired fasting glucose: Secondary | ICD-10-CM | POA: Diagnosis not present

## 2022-01-30 DIAGNOSIS — E7849 Other hyperlipidemia: Secondary | ICD-10-CM | POA: Diagnosis not present

## 2022-02-03 DIAGNOSIS — R7301 Impaired fasting glucose: Secondary | ICD-10-CM | POA: Diagnosis not present

## 2022-02-03 DIAGNOSIS — N138 Other obstructive and reflux uropathy: Secondary | ICD-10-CM | POA: Diagnosis not present

## 2022-02-03 DIAGNOSIS — E8881 Metabolic syndrome: Secondary | ICD-10-CM | POA: Diagnosis not present

## 2022-02-03 DIAGNOSIS — R1084 Generalized abdominal pain: Secondary | ICD-10-CM | POA: Diagnosis not present

## 2022-02-03 DIAGNOSIS — E7849 Other hyperlipidemia: Secondary | ICD-10-CM | POA: Diagnosis not present

## 2022-02-03 DIAGNOSIS — N401 Enlarged prostate with lower urinary tract symptoms: Secondary | ICD-10-CM | POA: Diagnosis not present

## 2022-02-03 DIAGNOSIS — N189 Chronic kidney disease, unspecified: Secondary | ICD-10-CM | POA: Diagnosis not present

## 2022-02-03 DIAGNOSIS — I10 Essential (primary) hypertension: Secondary | ICD-10-CM | POA: Diagnosis not present

## 2022-02-03 DIAGNOSIS — M1611 Unilateral primary osteoarthritis, right hip: Secondary | ICD-10-CM | POA: Diagnosis not present

## 2022-02-04 DIAGNOSIS — C300 Malignant neoplasm of nasal cavity: Secondary | ICD-10-CM | POA: Diagnosis not present

## 2022-02-21 DIAGNOSIS — Z95828 Presence of other vascular implants and grafts: Secondary | ICD-10-CM | POA: Diagnosis not present

## 2022-02-21 DIAGNOSIS — Z09 Encounter for follow-up examination after completed treatment for conditions other than malignant neoplasm: Secondary | ICD-10-CM | POA: Diagnosis not present

## 2022-03-12 DIAGNOSIS — I872 Venous insufficiency (chronic) (peripheral): Secondary | ICD-10-CM | POA: Diagnosis not present

## 2022-03-12 DIAGNOSIS — C300 Malignant neoplasm of nasal cavity: Secondary | ICD-10-CM | POA: Diagnosis not present

## 2022-03-12 DIAGNOSIS — E039 Hypothyroidism, unspecified: Secondary | ICD-10-CM | POA: Diagnosis not present

## 2022-03-12 DIAGNOSIS — G629 Polyneuropathy, unspecified: Secondary | ICD-10-CM | POA: Diagnosis not present

## 2022-03-19 DIAGNOSIS — C300 Malignant neoplasm of nasal cavity: Secondary | ICD-10-CM | POA: Diagnosis not present

## 2022-03-19 DIAGNOSIS — E230 Hypopituitarism: Secondary | ICD-10-CM | POA: Diagnosis not present

## 2022-03-19 DIAGNOSIS — E893 Postprocedural hypopituitarism: Secondary | ICD-10-CM | POA: Diagnosis not present

## 2022-03-31 DIAGNOSIS — J0100 Acute maxillary sinusitis, unspecified: Secondary | ICD-10-CM | POA: Diagnosis not present

## 2022-03-31 DIAGNOSIS — Z6831 Body mass index (BMI) 31.0-31.9, adult: Secondary | ICD-10-CM | POA: Diagnosis not present

## 2022-04-07 DIAGNOSIS — L570 Actinic keratosis: Secondary | ICD-10-CM | POA: Diagnosis not present

## 2022-04-16 ENCOUNTER — Ambulatory Visit: Payer: Medicare PPO | Admitting: "Endocrinology

## 2022-04-16 ENCOUNTER — Encounter: Payer: Self-pay | Admitting: "Endocrinology

## 2022-04-16 VITALS — BP 108/58 | HR 56 | Ht 73.0 in | Wt 233.2 lb

## 2022-04-16 DIAGNOSIS — E230 Hypopituitarism: Secondary | ICD-10-CM | POA: Diagnosis not present

## 2022-04-16 DIAGNOSIS — E039 Hypothyroidism, unspecified: Secondary | ICD-10-CM | POA: Insufficient documentation

## 2022-04-16 NOTE — Progress Notes (Signed)
Endocrinology Consult Note                                            04/16/2022, 4:38 PM   Subjective:    Patient ID: Mike Davis, male    DOB: Nov 10, 1940, PCP Sasser, Silvestre Moment, MD   Past Medical History:  Diagnosis Date   CHF (congestive heart failure) ([place])    DVT (deep venous thrombosis) ([place])    Eye cancer ([place]) 2005   right eye   Hypertension    Macular pucker, right    Pulmonary emboli ([facility])    Shortness of breath dyspnea    Past Surgical History:  Procedure Laterality Date   CATARACT EXTRACTION Right    LUMBAR SPINE SURGERY     Social History   Socioeconomic History   Marital status: Single    Spouse name: Not on file   Number of children: Not on file   Years of education: Not on file   Highest education level: Not on file  Occupational History   Not on file  Tobacco Use   Smoking status: Never   Smokeless tobacco: Never  Substance and Sexual Activity   Alcohol use: No   Drug use: No   Sexual activity: Never  Other Topics Concern   Not on file  Social History Narrative   Not on file   Social Determinants of Health   Financial Resource Strain: Not on file  Food Insecurity: Not on file  Transportation Needs: Not on file  Physical Activity: Not on file  Stress: Not on file  Social Connections: Not on file   Family History  Problem Relation Age of Onset   Brain cancer Mother    Stroke Father    Leukemia Brother    Outpatient Encounter Medications as of 04/16/2022  Medication Sig   Loratadine (CLARITIN PO) Take by mouth.   tamsulosin (FLOMAX) 0.4 MG CAPS capsule Take 1 capsule by mouth daily.   acetaminophen (TYLENOL) 325 MG tablet Take 650 mg by mouth every 6 (six) hours as needed (pain).   amoxicillin (AMOXIL) 500 MG tablet Take 1,500 mg by mouth 2 (two) times daily.   ascorbic acid (VITAMIN C) 500 MG tablet Take 500 mg by mouth daily.   calcium-vitamin D (OSCAL WITH D) 250-125 MG-UNIT tablet Take 1 tablet by mouth 2 (two) times  daily.   cholecalciferol (VITAMIN D) 1000 units tablet Take 1,000 Units by mouth daily.   folic acid (FOLVITE) 426 MCG tablet Take 800 mcg by mouth daily.   gabapentin (NEURONTIN) 300 MG capsule TAKE 2 CAPSULES (600 MG TOTAL) BY MOUTH 2 (TWO) TIMES DAILY. (Patient taking differently: Take 600 mg by mouth 3 (three) times daily.)   ibuprofen (ADVIL,MOTRIN) 200 MG tablet Take 600 mg by mouth every 6 (six) hours as needed (for pain with yardwork).   levothyroxine (SYNTHROID) 88 MCG tablet Take 88 mcg by mouth every morning.   losartan (COZAAR) 25 MG tablet TAKE 1 TABLET BY MOUTH EVERY MORNING FOR BLOOD PRESSURE   meloxicam (MOBIC) 15 MG tablet Take 15 mg by mouth daily.   metoprolol succinate (TOPROL-XL) 50 MG 24 hr tablet Take 50 mg by mouth 2 (two) times daily. Take with or immediately following a meal.   Multiple Vitamins-Minerals (MULTIVITAMIN WITH MINERALS) tablet Take 1 tablet by mouth daily.   Oxcarbazepine (TRILEPTAL) 300  MG tablet Take 1 tablet three times daily.   sodium chloride 1 g tablet Take 2 g by mouth 3 (three) times daily.   vitamin B-12 (CYANOCOBALAMIN) 500 MCG tablet Take 1,000 mcg by mouth daily.   zinc gluconate 50 MG tablet Take 50 mg by mouth daily.   No facility-administered encounter medications on file as of 04/16/2022.   ALLERGIES: Allergies  Allergen Reactions   Lamictal [Lamotrigine] Rash    VACCINATION STATUS:  There is no immunization history on file for this patient.  HPI Mike Davis is 81 y.o. male who presents today with a medical history as above. he is being seen in consultation for hypothyroidism secondary hypothyroidism requested by Manon Hilding, MD.   History is obtained directly from the patient as well as chart review.  Medical history is complicated including head and neck malignancy which required radiation therapy.  Subsequently, he was found to have hypothyroidism with at least 1 line of  hormone deficit.  He was started on levothyroxine,  currently 88 mcg p.o. daily before breakfast. She is a.m. cortisol was normal recently.  He denies polyuria/polydipsia. He does not have acute complaints currently. His other medical problems include hypertension, pulmonary embolism, history of DVT, prediabetes, gait disturbance.  Review of Systems  Constitutional: + Fluctuating body weight, + fatigue, no subjective hyperthermia, no subjective hypothermia Eyes: no blurry vision, no xerophthalmia ENT: no sore throat, no nodules palpated in throat, no dysphagia/odynophagia, no hoarseness Cardiovascular: no Chest Pain, no Shortness of Breath, no palpitations, no leg swelling Respiratory: no cough, no shortness of breath Gastrointestinal: no Nausea/Vomiting/Diarhhea Musculoskeletal: no muscle/joint aches Skin: no rashes Neurological: no tremors, no numbness, no tingling, no dizziness Psychiatric: no depression, no anxiety  Objective:       04/16/2022    9:27 AM 06/14/2019   10:39 AM 06/08/2018    8:49 AM  Vitals with BMI  Height '6\' 1"'$  '6\' 1"'$  '6\' 1"'$   Weight 233 lbs 3 oz 263 lbs 261 lbs  BMI 30.77 50.35 46.56  Systolic 812 751 700  Diastolic 58 75 75  Pulse 56 51 56    BP (!) 108/58   Pulse (!) 56   Ht '6\' 1"'$  (1.854 m)   Wt 233 lb 3.2 oz (105.8 kg)   BMI 30.77 kg/m   Wt Readings from Last 3 Encounters:  04/16/22 233 lb 3.2 oz (105.8 kg)  06/14/19 263 lb (119.3 kg)  06/08/18 261 lb (118.4 kg)    Physical Exam  Constitutional:  Body mass index is 30.77 kg/m.,  not in acute distress, normal state of mind Eyes: PERRLA, EOMI, no exophthalmos ENT: moist mucous membranes, nose plasty, no thyromegaly , no gross cervical lymphadenopathy Cardiovascular: normal precordial activity, Regular Rate and Rhythm, no Murmur/Rubs/Gallops Respiratory:  adequate breathing efforts, no gross chest deformity, Clear to auscultation bilaterally Gastrointestinal: abdomen soft, Non -tender, No distension, Bowel Sounds present, no gross  organomegaly Musculoskeletal: no gross deformities, strength intact in all four extremities Skin: moist, warm, no rashes Neurological: no tremor with outstretched hands, Deep tendon reflexes normal in bilateral lower extremities.  CMP ( most recent) CMP     Component Value Date/Time   NA 136 01/06/2016 2358   K 3.7 01/06/2016 2358   CL 107 01/06/2016 2358   CO2 21 (L) 01/06/2016 2358   GLUCOSE 106 (H) 01/06/2016 2358   BUN 15 01/06/2016 2358   CREATININE 0.89 01/06/2016 2358   CALCIUM 7.7 (L) 01/06/2016 2358   PROT 6.7 09/16/2016 1122  ALBUMIN 2.3 (L) 01/05/2016 0245   AST 87 (H) 01/05/2016 0245   ALT 84 (H) 01/05/2016 0245   ALKPHOS 87 01/05/2016 0245   BILITOT 0.8 01/05/2016 0245   GFRNONAA >60 01/06/2016 2358   GFRAA >60 01/06/2016 2358     Diabetic Labs (most recent): Lab Results  Component Value Date   HGBA1C 5.6 09/16/2016   HGBA1C 5.9 (H) 01/06/2016     Assessment & Plan:   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 - I have reviewed his available thyroid records and clinically evaluated the patient. - Based on these reviews, he has hypothyroidism with secondary hypothyroidism.  At this time, he seems to have intact Botanics-pituitary-adrenal axis, will not need glucocorticoid or mineralocorticoid replacement.  He has secondary hypothyroidism, currently on levothyroxine 88 mcg p.o. daily before breakfast.   - We discussed about the correct intake of his thyroid hormone, on empty stomach at fasting, with water, separated by at least 30 minutes from breakfast and other medications,  and separated by more than 4 hours from calcium, iron, multivitamins, acid reflux medications (PPIs). -Patient is made aware of the fact that thyroid hormone replacement is needed for life, dose to be adjusted by periodic monitoring of thyroid function tests.  Measurement of TSH is unreliable in  this patient, will use Free T4 to make dose adjustment.  No indication of diabetes insipidus at this time.  He is informed to notify clinic if he develops polyuria polydipsia. -He is not a suitable candidate for androgen replacement therapy.  -he will return in 12 week to review his repeat labs.  Comprehensive metabolic panel - Lipid panel - TSH - T4, free - Cortisol-am, blood - Osmolality  - He is encouraged to continue follow-up with his other providers including oncology. - he is advised to maintain close follow up with Sasser, Silvestre Moment, MD for primary care needs.   - Time spent with the patient: 50 minutes, of which >50% was spent in  counseling him about his hypothyroidism, hypothyroidism and the rest in obtaining information about his symptoms, reviewing his previous labs/studies ( including abstractions from other facilities),  evaluations, and treatments,  and developing a plan to confirm diagnosis and long term treatment based on the latest standards of care/guidelines; and documenting his care.  Mike Davis participated in the discussions, expressed understanding, and voiced agreement with the above plans.  All questions were answered to his satisfaction. he is encouraged to contact clinic should he have any questions or concerns prior to his return visit.  Follow up plan: Return in about 3 months (around 07/16/2022) for Fasting Labs  in AM B4 8.   Glade Lloyd, MD [facility] [address] [place], [place] [address] Phone: [phone]  Fax: [phone]     04/16/2022, 4:38 PM  This note was partially dictated with voice recognition software. Similar sounding words can be transcribed inadequately or may not  be corrected upon review.

## 2022-04-17 ENCOUNTER — Ambulatory Visit (INDEPENDENT_AMBULATORY_CARE_PROVIDER_SITE_OTHER): Payer: Medicare PPO | Admitting: Ophthalmology

## 2022-04-17 ENCOUNTER — Encounter (INDEPENDENT_AMBULATORY_CARE_PROVIDER_SITE_OTHER): Payer: Self-pay | Admitting: Ophthalmology

## 2022-04-17 DIAGNOSIS — H35373 Puckering of macula, bilateral: Secondary | ICD-10-CM

## 2022-04-17 DIAGNOSIS — H35372 Puckering of macula, left eye: Secondary | ICD-10-CM

## 2022-04-17 DIAGNOSIS — H35371 Puckering of macula, right eye: Secondary | ICD-10-CM | POA: Diagnosis not present

## 2022-04-17 DIAGNOSIS — H2512 Age-related nuclear cataract, left eye: Secondary | ICD-10-CM | POA: Diagnosis not present

## 2022-04-17 NOTE — Assessment & Plan Note (Signed)
OD, overall excellent acuity.

## 2022-04-17 NOTE — Progress Notes (Signed)
04/17/2022     CHIEF COMPLAINT Patient presents for  Chief Complaint  Patient presents with   Epiretinal Membrane/preretinal Fibrosis/cellophane Maculopathy      HISTORY OF PRESENT ILLNESS: Mike Davis is a 81 y.o. male who presents to the clinic today for:   HPI   Left epiretinal membrane, and OD 1 year fu ou oct Pt states his vision has been stable Pt denies any new floaters or FOL Last edited by Hurman Horn, MD on 04/17/2022  2:34 PM.      Referring physician: Clent Jacks, MD [address] [place],  [place] [address]  HISTORICAL INFORMATION:   Selected notes from the MEDICAL RECORD NUMBER    Lab Results  Component Value Date   HGBA1C 5.6 09/16/2016     CURRENT MEDICATIONS: No current outpatient medications on file. (Ophthalmic Drugs)   No current facility-administered medications for this visit. (Ophthalmic Drugs)   Current Outpatient Medications (Other)  Medication Sig   acetaminophen (TYLENOL) 325 MG tablet Take 650 mg by mouth every 6 (six) hours as needed (pain).   amoxicillin (AMOXIL) 500 MG tablet Take 1,500 mg by mouth 2 (two) times daily.   ascorbic acid (VITAMIN C) 500 MG tablet Take 500 mg by mouth daily.   calcium-vitamin D (OSCAL WITH D) 250-125 MG-UNIT tablet Take 1 tablet by mouth 2 (two) times daily.   cholecalciferol (VITAMIN D) 1000 units tablet Take 1,000 Units by mouth daily.   folic acid (FOLVITE) 299 MCG tablet Take 800 mcg by mouth daily.   gabapentin (NEURONTIN) 300 MG capsule TAKE 2 CAPSULES (600 MG TOTAL) BY MOUTH 2 (TWO) TIMES DAILY. (Patient taking differently: Take 600 mg by mouth 3 (three) times daily.)   ibuprofen (ADVIL,MOTRIN) 200 MG tablet Take 600 mg by mouth every 6 (six) hours as needed (for pain with yardwork).   levothyroxine (SYNTHROID) 88 MCG tablet Take 88 mcg by mouth every morning.   Loratadine (CLARITIN PO) Take by mouth.   losartan (COZAAR) 25 MG tablet TAKE 1 TABLET BY MOUTH EVERY MORNING FOR BLOOD  PRESSURE   meloxicam (MOBIC) 15 MG tablet Take 15 mg by mouth daily.   metoprolol succinate (TOPROL-XL) 50 MG 24 hr tablet Take 50 mg by mouth 2 (two) times daily. Take with or immediately following a meal.   Multiple Vitamins-Minerals (MULTIVITAMIN WITH MINERALS) tablet Take 1 tablet by mouth daily.   Oxcarbazepine (TRILEPTAL) 300 MG tablet Take 1 tablet three times daily.   sodium chloride 1 g tablet Take 2 g by mouth 3 (three) times daily.   tamsulosin (FLOMAX) 0.4 MG CAPS capsule Take 1 capsule by mouth daily.   vitamin B-12 (CYANOCOBALAMIN) 500 MCG tablet Take 1,000 mcg by mouth daily.   zinc gluconate 50 MG tablet Take 50 mg by mouth daily.   No current facility-administered medications for this visit. (Other)      REVIEW OF SYSTEMS: ROS   Negative for: Constitutional, Gastrointestinal, Neurological, Skin, Genitourinary, Musculoskeletal, HENT, Endocrine, Cardiovascular, Eyes, Respiratory, Psychiatric, Allergic/Imm, Heme/Lymph Last edited by Morene Rankins, CMA on 04/17/2022  1:14 PM.       ALLERGIES Allergies  Allergen Reactions   Lamictal [Lamotrigine] Rash    PAST MEDICAL HISTORY Past Medical History:  Diagnosis Date   CHF (congestive heart failure) ([place])    DVT (deep venous thrombosis) ([place])    Eye cancer ([place]) 2005   right eye   Hypertension    Macular pucker, right    Pulmonary emboli ([place])  Shortness of breath dyspnea    Past Surgical History:  Procedure Laterality Date   CATARACT EXTRACTION Right    LUMBAR SPINE SURGERY      FAMILY HISTORY Family History  Problem Relation Age of Onset   Brain cancer Mother    Stroke Father    Leukemia Brother     SOCIAL HISTORY Social History   Tobacco Use   Smoking status: Never   Smokeless tobacco: Never  Substance Use Topics   Alcohol use: No   Drug use: No         OPHTHALMIC EXAM:  Base Eye Exam     Visual Acuity (ETDRS)       Right Left   Dist cc 20/20 -1 20/25 +3    Correction:  Glasses         Tonometry (Tonopen, 1:17 PM)       Right Left   Pressure 8 6         Pupils       Pupils APD   Right PERRL None   Left PERRL None         Visual Fields       Left Right    Full Full         Extraocular Movement       Right Left    Full, Ortho Full, Ortho         Neuro/Psych     Oriented x3: Yes   Mood/Affect: Normal         Dilation     Both eyes: 1.0% Mydriacyl, 2.5% Phenylephrine @ 1:15 PM           Slit Lamp and Fundus Exam     External Exam       Right Left   External Normal Normal         Slit Lamp Exam       Right Left   Lids/Lashes Normal Normal   Conjunctiva/Sclera White and quiet White and quiet   Cornea Clear Clear   Anterior Chamber Deep and quiet Deep and quiet   Iris Round and reactive Round and reactive   Lens Centered posterior chamber intraocular lens 3+ Nuclear sclerosis   Anterior Vitreous Normal Normal         Fundus Exam       Right Left   Posterior Vitreous Vitrectomized Posterior vitreous detachment   Disc Normal Normal   C/D Ratio 0.25 0.25   Macula Normal Epiretinal membrane mild topo distortion   Vessels Normal Normal   Periphery Normal Normal            IMAGING AND PROCEDURES  Imaging and Procedures for 04/17/22  OCT, Retina - OU - Both Eyes       Right Eye Quality was good. Scan locations included subfoveal. Central Foveal Thickness: 358. Progression has been stable. Findings include epiretinal membrane.   Left Eye Quality was good. Scan locations included subfoveal. Central Foveal Thickness: 344. Progression has been stable. Findings include epiretinal membrane.   Notes OD, residual thickening from prior epiretinal membrane, minor nasal to FAZ will observe Minor ILM change nasal to the fovea, not visually significant, with good acuity and no impact on fovea    OS, epiretinal membrane with ILM distorting the underlying fovea, minor will observe stable over  time              ASSESSMENT/PLAN:  Right epiretinal membrane OD, overall excellent acuity.  Nuclear sclerotic cataract of left eye  Follow-up with Dr. Clent Jacks as scheduled  Left epiretinal membrane OS minor epiretinal membrane no impact on acuity.  Should have cataract surgery left eye first to determine at some point whether or not the left eye is even troubled by the minor epiretinal membrane present.     ICD-10-CM   1. Left epiretinal membrane  H35.372 OCT, Retina - OU - Both Eyes    2. Right epiretinal membrane  H35.371     3. Nuclear sclerotic cataract of left eye  H25.12       1.  2.  3.  Ophthalmic Meds Ordered this visit:  No orders of the defined types were placed in this encounter.      Return in about 1 year (around 04/18/2023) for DILATE OU, OCT,, 2-year follow-up appropriate.  There are no Patient Instructions on file for this visit.   Explained the diagnoses, plan, and follow up with the patient and they expressed understanding.  Patient expressed understanding of the importance of proper follow up care.   Clent Demark Lauriann Milillo M.D. Diseases & Surgery of the Retina and Vitreous [facility]ina [facility] [place] 04/17/22     Abbreviations: M myopia (nearsighted); A astigmatism; H hyperopia (farsighted); P presbyopia; Mrx spectacle prescription;  CTL contact lenses; OD right eye; OS left eye; OU both eyes  XT exotropia; ET esotropia; PEK punctate epithelial keratitis; PEE punctate epithelial erosions; DES dry eye syndrome; MGD meibomian gland dysfunction; ATs artificial tears; PFAT's preservative free artificial tears; [place] nuclear sclerotic cataract; PSC posterior subcapsular cataract; ERM epi-retinal membrane; PVD posterior vitreous detachment; RD retinal detachment; DM diabetes mellitus; DR diabetic retinopathy; NPDR non-proliferative diabetic retinopathy; PDR proliferative diabetic retinopathy; CSME clinically significant macular edema; DME diabetic  macular edema; dbh dot blot hemorrhages; CWS cotton wool spot; POAG primary open angle glaucoma; C/D cup-to-disc ratio; HVF humphrey visual field; GVF goldmann visual field; OCT optical coherence tomography; IOP intraocular pressure; BRVO Branch retinal vein occlusion; CRVO central retinal vein occlusion; CRAO central retinal artery occlusion; BRAO branch retinal artery occlusion; RT retinal tear; SB scleral buckle; PPV pars plana vitrectomy; VH Vitreous hemorrhage; PRP panretinal laser photocoagulation; IVK intravitreal kenalog; VMT vitreomacular traction; MH Macular hole;  NVD neovascularization of the disc; NVE neovascularization elsewhere; AREDS age related eye disease study; ARMD age related macular degeneration; POAG primary open angle glaucoma; EBMD epithelial/anterior basement membrane dystrophy; ACIOL anterior chamber intraocular lens; IOL intraocular lens; PCIOL posterior chamber intraocular lens; Phaco/IOL phacoemulsification with intraocular lens placement; [place] photorefractive keratectomy; LASIK laser assisted in situ keratomileusis; HTN hypertension; DM diabetes mellitus; COPD chronic obstructive pulmonary disease

## 2022-04-17 NOTE — Assessment & Plan Note (Signed)
Follow-up with Dr. Clent Jacks as scheduled

## 2022-04-17 NOTE — Assessment & Plan Note (Signed)
OS minor epiretinal membrane no impact on acuity.  Should have cataract surgery left eye first to determine at some point whether or not the left eye is even troubled by the minor epiretinal membrane present.

## 2022-04-28 DIAGNOSIS — Z95828 Presence of other vascular implants and grafts: Secondary | ICD-10-CM | POA: Diagnosis not present

## 2022-05-02 DIAGNOSIS — Z23 Encounter for immunization: Secondary | ICD-10-CM | POA: Diagnosis not present

## 2022-05-06 DIAGNOSIS — Z85828 Personal history of other malignant neoplasm of skin: Secondary | ICD-10-CM | POA: Diagnosis not present

## 2022-05-06 DIAGNOSIS — Z08 Encounter for follow-up examination after completed treatment for malignant neoplasm: Secondary | ICD-10-CM | POA: Diagnosis not present

## 2022-05-06 DIAGNOSIS — Z923 Personal history of irradiation: Secondary | ICD-10-CM | POA: Diagnosis not present

## 2022-05-06 DIAGNOSIS — Z9221 Personal history of antineoplastic chemotherapy: Secondary | ICD-10-CM | POA: Diagnosis not present

## 2022-05-06 DIAGNOSIS — C300 Malignant neoplasm of nasal cavity: Secondary | ICD-10-CM | POA: Diagnosis not present

## 2022-05-06 DIAGNOSIS — Z9889 Other specified postprocedural states: Secondary | ICD-10-CM | POA: Diagnosis not present

## 2022-05-13 DIAGNOSIS — C300 Malignant neoplasm of nasal cavity: Secondary | ICD-10-CM | POA: Diagnosis not present

## 2022-05-20 DIAGNOSIS — Z8522 Personal history of malignant neoplasm of nasal cavities, middle ear, and accessory sinuses: Secondary | ICD-10-CM | POA: Diagnosis not present

## 2022-05-20 DIAGNOSIS — J392 Other diseases of pharynx: Secondary | ICD-10-CM | POA: Diagnosis not present

## 2022-05-20 DIAGNOSIS — E230 Hypopituitarism: Secondary | ICD-10-CM | POA: Diagnosis not present

## 2022-05-20 DIAGNOSIS — J387 Other diseases of larynx: Secondary | ICD-10-CM | POA: Diagnosis not present

## 2022-05-20 DIAGNOSIS — C300 Malignant neoplasm of nasal cavity: Secondary | ICD-10-CM | POA: Diagnosis not present

## 2022-05-20 DIAGNOSIS — E041 Nontoxic single thyroid nodule: Secondary | ICD-10-CM | POA: Diagnosis not present

## 2022-05-23 DIAGNOSIS — C300 Malignant neoplasm of nasal cavity: Secondary | ICD-10-CM | POA: Diagnosis not present

## 2022-05-23 DIAGNOSIS — E230 Hypopituitarism: Secondary | ICD-10-CM | POA: Diagnosis not present

## 2022-06-03 DIAGNOSIS — Z23 Encounter for immunization: Secondary | ICD-10-CM | POA: Diagnosis not present

## 2022-06-03 DIAGNOSIS — T148XXA Other injury of unspecified body region, initial encounter: Secondary | ICD-10-CM | POA: Diagnosis not present

## 2022-06-03 DIAGNOSIS — S0990XA Unspecified injury of head, initial encounter: Secondary | ICD-10-CM | POA: Diagnosis not present

## 2022-06-03 DIAGNOSIS — R030 Elevated blood-pressure reading, without diagnosis of hypertension: Secondary | ICD-10-CM | POA: Diagnosis not present

## 2022-06-03 DIAGNOSIS — Z6831 Body mass index (BMI) 31.0-31.9, adult: Secondary | ICD-10-CM | POA: Diagnosis not present

## 2022-06-03 DIAGNOSIS — S60229A Contusion of unspecified hand, initial encounter: Secondary | ICD-10-CM | POA: Diagnosis not present

## 2022-06-13 DIAGNOSIS — E039 Hypothyroidism, unspecified: Secondary | ICD-10-CM | POA: Diagnosis not present

## 2022-06-13 DIAGNOSIS — I10 Essential (primary) hypertension: Secondary | ICD-10-CM | POA: Diagnosis not present

## 2022-06-13 DIAGNOSIS — R296 Repeated falls: Secondary | ICD-10-CM | POA: Diagnosis not present

## 2022-06-13 DIAGNOSIS — R2689 Other abnormalities of gait and mobility: Secondary | ICD-10-CM | POA: Diagnosis not present

## 2022-06-13 DIAGNOSIS — Z6831 Body mass index (BMI) 31.0-31.9, adult: Secondary | ICD-10-CM | POA: Diagnosis not present

## 2022-06-13 DIAGNOSIS — Z23 Encounter for immunization: Secondary | ICD-10-CM | POA: Diagnosis not present

## 2022-06-13 DIAGNOSIS — E7849 Other hyperlipidemia: Secondary | ICD-10-CM | POA: Diagnosis not present

## 2022-06-13 DIAGNOSIS — S50319A Abrasion of unspecified elbow, initial encounter: Secondary | ICD-10-CM | POA: Diagnosis not present

## 2022-06-19 DIAGNOSIS — R2689 Other abnormalities of gait and mobility: Secondary | ICD-10-CM | POA: Diagnosis not present

## 2022-06-19 DIAGNOSIS — R296 Repeated falls: Secondary | ICD-10-CM | POA: Diagnosis not present

## 2022-06-23 DIAGNOSIS — R296 Repeated falls: Secondary | ICD-10-CM | POA: Diagnosis not present

## 2022-06-23 DIAGNOSIS — R2689 Other abnormalities of gait and mobility: Secondary | ICD-10-CM | POA: Diagnosis not present

## 2022-06-24 DIAGNOSIS — D4102 Neoplasm of uncertain behavior of left kidney: Secondary | ICD-10-CM | POA: Diagnosis not present

## 2022-06-24 DIAGNOSIS — C300 Malignant neoplasm of nasal cavity: Secondary | ICD-10-CM | POA: Diagnosis not present

## 2022-06-25 DIAGNOSIS — R296 Repeated falls: Secondary | ICD-10-CM | POA: Diagnosis not present

## 2022-06-25 DIAGNOSIS — R2689 Other abnormalities of gait and mobility: Secondary | ICD-10-CM | POA: Diagnosis not present

## 2022-06-30 DIAGNOSIS — R2689 Other abnormalities of gait and mobility: Secondary | ICD-10-CM | POA: Diagnosis not present

## 2022-06-30 DIAGNOSIS — R296 Repeated falls: Secondary | ICD-10-CM | POA: Diagnosis not present

## 2022-07-03 DIAGNOSIS — R2689 Other abnormalities of gait and mobility: Secondary | ICD-10-CM | POA: Diagnosis not present

## 2022-07-03 DIAGNOSIS — R296 Repeated falls: Secondary | ICD-10-CM | POA: Diagnosis not present

## 2022-07-10 DIAGNOSIS — R296 Repeated falls: Secondary | ICD-10-CM | POA: Diagnosis not present

## 2022-07-10 DIAGNOSIS — R2689 Other abnormalities of gait and mobility: Secondary | ICD-10-CM | POA: Diagnosis not present

## 2022-07-16 DIAGNOSIS — R296 Repeated falls: Secondary | ICD-10-CM | POA: Diagnosis not present

## 2022-07-16 DIAGNOSIS — R2689 Other abnormalities of gait and mobility: Secondary | ICD-10-CM | POA: Diagnosis not present

## 2022-07-18 DIAGNOSIS — R2689 Other abnormalities of gait and mobility: Secondary | ICD-10-CM | POA: Diagnosis not present

## 2022-07-18 DIAGNOSIS — R296 Repeated falls: Secondary | ICD-10-CM | POA: Diagnosis not present

## 2022-07-22 ENCOUNTER — Ambulatory Visit: Payer: Medicare PPO | Admitting: "Endocrinology

## 2022-07-23 DIAGNOSIS — Z131 Encounter for screening for diabetes mellitus: Secondary | ICD-10-CM | POA: Diagnosis not present

## 2022-07-23 DIAGNOSIS — E039 Hypothyroidism, unspecified: Secondary | ICD-10-CM | POA: Diagnosis not present

## 2022-07-23 DIAGNOSIS — I10 Essential (primary) hypertension: Secondary | ICD-10-CM | POA: Diagnosis not present

## 2022-07-23 DIAGNOSIS — E7849 Other hyperlipidemia: Secondary | ICD-10-CM | POA: Diagnosis not present

## 2022-07-23 DIAGNOSIS — R5383 Other fatigue: Secondary | ICD-10-CM | POA: Diagnosis not present

## 2022-07-24 DIAGNOSIS — R296 Repeated falls: Secondary | ICD-10-CM | POA: Diagnosis not present

## 2022-07-24 DIAGNOSIS — R2689 Other abnormalities of gait and mobility: Secondary | ICD-10-CM | POA: Diagnosis not present

## 2022-07-28 DIAGNOSIS — N401 Enlarged prostate with lower urinary tract symptoms: Secondary | ICD-10-CM | POA: Diagnosis not present

## 2022-07-28 DIAGNOSIS — R7301 Impaired fasting glucose: Secondary | ICD-10-CM | POA: Diagnosis not present

## 2022-07-28 DIAGNOSIS — G6289 Other specified polyneuropathies: Secondary | ICD-10-CM | POA: Diagnosis not present

## 2022-07-28 DIAGNOSIS — R1084 Generalized abdominal pain: Secondary | ICD-10-CM | POA: Diagnosis not present

## 2022-07-28 DIAGNOSIS — N189 Chronic kidney disease, unspecified: Secondary | ICD-10-CM | POA: Diagnosis not present

## 2022-07-28 DIAGNOSIS — Z0001 Encounter for general adult medical examination with abnormal findings: Secondary | ICD-10-CM | POA: Diagnosis not present

## 2022-07-28 DIAGNOSIS — E7849 Other hyperlipidemia: Secondary | ICD-10-CM | POA: Diagnosis not present

## 2022-07-28 DIAGNOSIS — I10 Essential (primary) hypertension: Secondary | ICD-10-CM | POA: Diagnosis not present

## 2022-07-28 DIAGNOSIS — N138 Other obstructive and reflux uropathy: Secondary | ICD-10-CM | POA: Diagnosis not present

## 2022-07-31 DIAGNOSIS — R2689 Other abnormalities of gait and mobility: Secondary | ICD-10-CM | POA: Diagnosis not present

## 2022-07-31 DIAGNOSIS — R296 Repeated falls: Secondary | ICD-10-CM | POA: Diagnosis not present

## 2022-08-05 DIAGNOSIS — R296 Repeated falls: Secondary | ICD-10-CM | POA: Diagnosis not present

## 2022-08-05 DIAGNOSIS — R2689 Other abnormalities of gait and mobility: Secondary | ICD-10-CM | POA: Diagnosis not present

## 2022-08-07 DIAGNOSIS — R2689 Other abnormalities of gait and mobility: Secondary | ICD-10-CM | POA: Diagnosis not present

## 2022-08-07 DIAGNOSIS — R296 Repeated falls: Secondary | ICD-10-CM | POA: Diagnosis not present

## 2022-08-12 DIAGNOSIS — R2689 Other abnormalities of gait and mobility: Secondary | ICD-10-CM | POA: Diagnosis not present

## 2022-08-12 DIAGNOSIS — R296 Repeated falls: Secondary | ICD-10-CM | POA: Diagnosis not present

## 2022-08-14 DIAGNOSIS — R2689 Other abnormalities of gait and mobility: Secondary | ICD-10-CM | POA: Diagnosis not present

## 2022-08-14 DIAGNOSIS — R296 Repeated falls: Secondary | ICD-10-CM | POA: Diagnosis not present

## 2022-08-20 DIAGNOSIS — H26491 Other secondary cataract, right eye: Secondary | ICD-10-CM | POA: Diagnosis not present

## 2022-08-20 DIAGNOSIS — Z452 Encounter for adjustment and management of vascular access device: Secondary | ICD-10-CM | POA: Diagnosis not present

## 2022-08-20 DIAGNOSIS — I872 Venous insufficiency (chronic) (peripheral): Secondary | ICD-10-CM | POA: Diagnosis not present

## 2022-08-20 DIAGNOSIS — E039 Hypothyroidism, unspecified: Secondary | ICD-10-CM | POA: Diagnosis not present

## 2022-08-20 DIAGNOSIS — J343 Hypertrophy of nasal turbinates: Secondary | ICD-10-CM | POA: Diagnosis not present

## 2022-08-20 DIAGNOSIS — H43812 Vitreous degeneration, left eye: Secondary | ICD-10-CM | POA: Diagnosis not present

## 2022-08-20 DIAGNOSIS — H04202 Unspecified epiphora, left lacrimal gland: Secondary | ICD-10-CM | POA: Diagnosis not present

## 2022-08-20 DIAGNOSIS — H2512 Age-related nuclear cataract, left eye: Secondary | ICD-10-CM | POA: Diagnosis not present

## 2022-08-20 DIAGNOSIS — M5136 Other intervertebral disc degeneration, lumbar region: Secondary | ICD-10-CM | POA: Diagnosis not present

## 2022-08-20 DIAGNOSIS — C300 Malignant neoplasm of nasal cavity: Secondary | ICD-10-CM | POA: Diagnosis not present

## 2022-08-20 DIAGNOSIS — E893 Postprocedural hypopituitarism: Secondary | ICD-10-CM | POA: Diagnosis not present

## 2022-08-20 DIAGNOSIS — H35372 Puckering of macula, left eye: Secondary | ICD-10-CM | POA: Diagnosis not present

## 2022-08-20 DIAGNOSIS — D4102 Neoplasm of uncertain behavior of left kidney: Secondary | ICD-10-CM | POA: Diagnosis not present

## 2022-08-20 DIAGNOSIS — Z961 Presence of intraocular lens: Secondary | ICD-10-CM | POA: Diagnosis not present

## 2022-08-27 DIAGNOSIS — C300 Malignant neoplasm of nasal cavity: Secondary | ICD-10-CM | POA: Diagnosis not present

## 2022-11-11 DIAGNOSIS — C300 Malignant neoplasm of nasal cavity: Secondary | ICD-10-CM | POA: Diagnosis not present

## 2022-11-21 DIAGNOSIS — E230 Hypopituitarism: Secondary | ICD-10-CM | POA: Diagnosis not present

## 2022-11-21 DIAGNOSIS — E871 Hypo-osmolality and hyponatremia: Secondary | ICD-10-CM | POA: Diagnosis not present

## 2022-11-21 DIAGNOSIS — E893 Postprocedural hypopituitarism: Secondary | ICD-10-CM | POA: Diagnosis not present

## 2022-11-21 DIAGNOSIS — Z95828 Presence of other vascular implants and grafts: Secondary | ICD-10-CM | POA: Diagnosis not present

## 2022-11-21 DIAGNOSIS — C300 Malignant neoplasm of nasal cavity: Secondary | ICD-10-CM | POA: Diagnosis not present

## 2022-11-28 DIAGNOSIS — E893 Postprocedural hypopituitarism: Secondary | ICD-10-CM | POA: Diagnosis not present

## 2022-11-28 DIAGNOSIS — C300 Malignant neoplasm of nasal cavity: Secondary | ICD-10-CM | POA: Diagnosis not present

## 2022-11-28 DIAGNOSIS — E871 Hypo-osmolality and hyponatremia: Secondary | ICD-10-CM | POA: Diagnosis not present

## 2023-01-12 ENCOUNTER — Telehealth: Payer: Self-pay | Admitting: "Endocrinology

## 2023-01-12 DIAGNOSIS — E039 Hypothyroidism, unspecified: Secondary | ICD-10-CM

## 2023-01-12 DIAGNOSIS — E230 Hypopituitarism: Secondary | ICD-10-CM

## 2023-01-12 NOTE — Telephone Encounter (Signed)
Labs updated and sent to [facility]. 

## 2023-01-12 NOTE — Telephone Encounter (Signed)
PCP is asking for pt to be seen. Do you want updated labs or can you use what he done recently?

## 2023-01-16 DIAGNOSIS — Z95828 Presence of other vascular implants and grafts: Secondary | ICD-10-CM | POA: Diagnosis not present

## 2023-01-26 DIAGNOSIS — E7849 Other hyperlipidemia: Secondary | ICD-10-CM | POA: Diagnosis not present

## 2023-01-26 DIAGNOSIS — N183 Chronic kidney disease, stage 3 unspecified: Secondary | ICD-10-CM | POA: Diagnosis not present

## 2023-01-26 DIAGNOSIS — I10 Essential (primary) hypertension: Secondary | ICD-10-CM | POA: Diagnosis not present

## 2023-01-26 DIAGNOSIS — E039 Hypothyroidism, unspecified: Secondary | ICD-10-CM | POA: Diagnosis not present

## 2023-02-02 DIAGNOSIS — N401 Enlarged prostate with lower urinary tract symptoms: Secondary | ICD-10-CM | POA: Diagnosis not present

## 2023-02-02 DIAGNOSIS — N189 Chronic kidney disease, unspecified: Secondary | ICD-10-CM | POA: Diagnosis not present

## 2023-02-02 DIAGNOSIS — R7301 Impaired fasting glucose: Secondary | ICD-10-CM | POA: Diagnosis not present

## 2023-02-02 DIAGNOSIS — E7849 Other hyperlipidemia: Secondary | ICD-10-CM | POA: Diagnosis not present

## 2023-02-02 DIAGNOSIS — M1611 Unilateral primary osteoarthritis, right hip: Secondary | ICD-10-CM | POA: Diagnosis not present

## 2023-02-02 DIAGNOSIS — R1084 Generalized abdominal pain: Secondary | ICD-10-CM | POA: Diagnosis not present

## 2023-02-02 DIAGNOSIS — I10 Essential (primary) hypertension: Secondary | ICD-10-CM | POA: Diagnosis not present

## 2023-02-02 DIAGNOSIS — N138 Other obstructive and reflux uropathy: Secondary | ICD-10-CM | POA: Diagnosis not present

## 2023-02-02 DIAGNOSIS — G6289 Other specified polyneuropathies: Secondary | ICD-10-CM | POA: Diagnosis not present

## 2023-03-03 DIAGNOSIS — C300 Malignant neoplasm of nasal cavity: Secondary | ICD-10-CM | POA: Diagnosis not present

## 2023-03-03 DIAGNOSIS — E893 Postprocedural hypopituitarism: Secondary | ICD-10-CM | POA: Diagnosis not present

## 2023-03-03 DIAGNOSIS — Z95828 Presence of other vascular implants and grafts: Secondary | ICD-10-CM | POA: Diagnosis not present

## 2023-04-02 DIAGNOSIS — E893 Postprocedural hypopituitarism: Secondary | ICD-10-CM | POA: Diagnosis not present

## 2023-04-02 DIAGNOSIS — C300 Malignant neoplasm of nasal cavity: Secondary | ICD-10-CM | POA: Diagnosis not present

## 2023-04-21 ENCOUNTER — Encounter (INDEPENDENT_AMBULATORY_CARE_PROVIDER_SITE_OTHER): Payer: Medicare PPO | Admitting: Ophthalmology

## 2023-04-21 DIAGNOSIS — H2512 Age-related nuclear cataract, left eye: Secondary | ICD-10-CM | POA: Diagnosis not present

## 2023-04-21 DIAGNOSIS — H35373 Puckering of macula, bilateral: Secondary | ICD-10-CM | POA: Diagnosis not present

## 2023-04-21 DIAGNOSIS — H26491 Other secondary cataract, right eye: Secondary | ICD-10-CM | POA: Diagnosis not present

## 2023-05-12 DIAGNOSIS — Z23 Encounter for immunization: Secondary | ICD-10-CM | POA: Diagnosis not present

## 2023-05-19 DIAGNOSIS — C300 Malignant neoplasm of nasal cavity: Secondary | ICD-10-CM | POA: Diagnosis not present

## 2023-06-01 DIAGNOSIS — E871 Hypo-osmolality and hyponatremia: Secondary | ICD-10-CM | POA: Diagnosis not present

## 2023-06-01 DIAGNOSIS — E230 Hypopituitarism: Secondary | ICD-10-CM | POA: Diagnosis not present

## 2023-06-01 DIAGNOSIS — C300 Malignant neoplasm of nasal cavity: Secondary | ICD-10-CM | POA: Diagnosis not present

## 2023-06-01 DIAGNOSIS — E893 Postprocedural hypopituitarism: Secondary | ICD-10-CM | POA: Diagnosis not present

## 2023-06-04 DIAGNOSIS — C300 Malignant neoplasm of nasal cavity: Secondary | ICD-10-CM | POA: Diagnosis not present

## 2023-06-05 DIAGNOSIS — C300 Malignant neoplasm of nasal cavity: Secondary | ICD-10-CM | POA: Diagnosis not present

## 2023-06-05 DIAGNOSIS — E871 Hypo-osmolality and hyponatremia: Secondary | ICD-10-CM | POA: Diagnosis not present

## 2023-06-05 DIAGNOSIS — E230 Hypopituitarism: Secondary | ICD-10-CM | POA: Diagnosis not present

## 2023-06-05 DIAGNOSIS — E893 Postprocedural hypopituitarism: Secondary | ICD-10-CM | POA: Diagnosis not present

## 2023-07-13 DIAGNOSIS — Z09 Encounter for follow-up examination after completed treatment for conditions other than malignant neoplasm: Secondary | ICD-10-CM | POA: Diagnosis not present

## 2023-07-13 DIAGNOSIS — Z95828 Presence of other vascular implants and grafts: Secondary | ICD-10-CM | POA: Diagnosis not present

## 2023-07-30 DIAGNOSIS — N183 Chronic kidney disease, stage 3 unspecified: Secondary | ICD-10-CM | POA: Diagnosis not present

## 2023-07-30 DIAGNOSIS — E871 Hypo-osmolality and hyponatremia: Secondary | ICD-10-CM | POA: Diagnosis not present

## 2023-07-30 DIAGNOSIS — R5383 Other fatigue: Secondary | ICD-10-CM | POA: Diagnosis not present

## 2023-07-30 DIAGNOSIS — E7849 Other hyperlipidemia: Secondary | ICD-10-CM | POA: Diagnosis not present

## 2023-07-30 DIAGNOSIS — E039 Hypothyroidism, unspecified: Secondary | ICD-10-CM | POA: Diagnosis not present

## 2023-07-30 DIAGNOSIS — R7301 Impaired fasting glucose: Secondary | ICD-10-CM | POA: Diagnosis not present

## 2023-08-14 DIAGNOSIS — E782 Mixed hyperlipidemia: Secondary | ICD-10-CM | POA: Diagnosis not present

## 2023-08-14 DIAGNOSIS — I10 Essential (primary) hypertension: Secondary | ICD-10-CM | POA: Diagnosis not present

## 2023-08-14 DIAGNOSIS — Z6830 Body mass index (BMI) 30.0-30.9, adult: Secondary | ICD-10-CM | POA: Diagnosis not present

## 2023-08-14 DIAGNOSIS — E039 Hypothyroidism, unspecified: Secondary | ICD-10-CM | POA: Diagnosis not present

## 2023-08-14 DIAGNOSIS — N401 Enlarged prostate with lower urinary tract symptoms: Secondary | ICD-10-CM | POA: Diagnosis not present

## 2023-08-24 DIAGNOSIS — Z452 Encounter for adjustment and management of vascular access device: Secondary | ICD-10-CM | POA: Diagnosis not present

## 2023-08-26 DIAGNOSIS — H2512 Age-related nuclear cataract, left eye: Secondary | ICD-10-CM | POA: Diagnosis not present

## 2023-08-26 DIAGNOSIS — H04202 Unspecified epiphora, left lacrimal gland: Secondary | ICD-10-CM | POA: Diagnosis not present

## 2023-08-26 DIAGNOSIS — H26491 Other secondary cataract, right eye: Secondary | ICD-10-CM | POA: Diagnosis not present

## 2023-08-26 DIAGNOSIS — H43812 Vitreous degeneration, left eye: Secondary | ICD-10-CM | POA: Diagnosis not present

## 2023-08-26 DIAGNOSIS — Z961 Presence of intraocular lens: Secondary | ICD-10-CM | POA: Diagnosis not present

## 2023-08-26 DIAGNOSIS — H35372 Puckering of macula, left eye: Secondary | ICD-10-CM | POA: Diagnosis not present

## 2023-10-05 DIAGNOSIS — Z95828 Presence of other vascular implants and grafts: Secondary | ICD-10-CM | POA: Diagnosis not present

## 2023-10-05 DIAGNOSIS — C300 Malignant neoplasm of nasal cavity: Secondary | ICD-10-CM | POA: Diagnosis not present

## 2023-11-16 DIAGNOSIS — C300 Malignant neoplasm of nasal cavity: Secondary | ICD-10-CM | POA: Diagnosis not present

## 2023-11-16 DIAGNOSIS — Z95828 Presence of other vascular implants and grafts: Secondary | ICD-10-CM | POA: Diagnosis not present

## 2023-11-17 DIAGNOSIS — C300 Malignant neoplasm of nasal cavity: Secondary | ICD-10-CM | POA: Diagnosis not present

## 2023-12-04 DIAGNOSIS — C300 Malignant neoplasm of nasal cavity: Secondary | ICD-10-CM | POA: Diagnosis not present

## 2023-12-30 DIAGNOSIS — Z95828 Presence of other vascular implants and grafts: Secondary | ICD-10-CM | POA: Diagnosis not present

## 2023-12-30 DIAGNOSIS — C300 Malignant neoplasm of nasal cavity: Secondary | ICD-10-CM | POA: Diagnosis not present

## 2024-02-05 DIAGNOSIS — R7301 Impaired fasting glucose: Secondary | ICD-10-CM | POA: Diagnosis not present

## 2024-02-05 DIAGNOSIS — E7849 Other hyperlipidemia: Secondary | ICD-10-CM | POA: Diagnosis not present

## 2024-02-05 DIAGNOSIS — E039 Hypothyroidism, unspecified: Secondary | ICD-10-CM | POA: Diagnosis not present

## 2024-02-05 DIAGNOSIS — I10 Essential (primary) hypertension: Secondary | ICD-10-CM | POA: Diagnosis not present

## 2024-02-12 DIAGNOSIS — N401 Enlarged prostate with lower urinary tract symptoms: Secondary | ICD-10-CM | POA: Diagnosis not present

## 2024-02-12 DIAGNOSIS — Z6830 Body mass index (BMI) 30.0-30.9, adult: Secondary | ICD-10-CM | POA: Diagnosis not present

## 2024-02-12 DIAGNOSIS — R7301 Impaired fasting glucose: Secondary | ICD-10-CM | POA: Diagnosis not present

## 2024-02-12 DIAGNOSIS — E039 Hypothyroidism, unspecified: Secondary | ICD-10-CM | POA: Diagnosis not present

## 2024-02-12 DIAGNOSIS — I10 Essential (primary) hypertension: Secondary | ICD-10-CM | POA: Diagnosis not present

## 2024-02-17 DIAGNOSIS — Z95828 Presence of other vascular implants and grafts: Secondary | ICD-10-CM | POA: Diagnosis not present

## 2024-03-15 DIAGNOSIS — Z6829 Body mass index (BMI) 29.0-29.9, adult: Secondary | ICD-10-CM | POA: Diagnosis not present

## 2024-03-15 DIAGNOSIS — K5900 Constipation, unspecified: Secondary | ICD-10-CM | POA: Diagnosis not present

## 2024-03-30 DIAGNOSIS — C300 Malignant neoplasm of nasal cavity: Secondary | ICD-10-CM | POA: Diagnosis not present

## 2024-04-20 DIAGNOSIS — R197 Diarrhea, unspecified: Secondary | ICD-10-CM | POA: Diagnosis not present

## 2024-04-20 DIAGNOSIS — Z6829 Body mass index (BMI) 29.0-29.9, adult: Secondary | ICD-10-CM | POA: Diagnosis not present

## 2024-04-20 DIAGNOSIS — R0602 Shortness of breath: Secondary | ICD-10-CM | POA: Diagnosis not present

## 2024-05-02 DIAGNOSIS — H26491 Other secondary cataract, right eye: Secondary | ICD-10-CM | POA: Diagnosis not present

## 2024-05-02 DIAGNOSIS — H2512 Age-related nuclear cataract, left eye: Secondary | ICD-10-CM | POA: Diagnosis not present

## 2024-05-02 DIAGNOSIS — H35373 Puckering of macula, bilateral: Secondary | ICD-10-CM | POA: Diagnosis not present

## 2024-05-04 DIAGNOSIS — Z23 Encounter for immunization: Secondary | ICD-10-CM | POA: Diagnosis not present

## 2024-05-04 DIAGNOSIS — R197 Diarrhea, unspecified: Secondary | ICD-10-CM | POA: Diagnosis not present

## 2024-05-04 DIAGNOSIS — Z6829 Body mass index (BMI) 29.0-29.9, adult: Secondary | ICD-10-CM | POA: Diagnosis not present

## 2024-05-11 DIAGNOSIS — C300 Malignant neoplasm of nasal cavity: Secondary | ICD-10-CM | POA: Diagnosis not present

## 2024-05-11 DIAGNOSIS — Z95828 Presence of other vascular implants and grafts: Secondary | ICD-10-CM | POA: Diagnosis not present

## 2024-05-17 DIAGNOSIS — C300 Malignant neoplasm of nasal cavity: Secondary | ICD-10-CM | POA: Diagnosis not present

## 2024-06-02 DIAGNOSIS — C300 Malignant neoplasm of nasal cavity: Secondary | ICD-10-CM | POA: Diagnosis not present

## 2024-06-22 DIAGNOSIS — Z452 Encounter for adjustment and management of vascular access device: Secondary | ICD-10-CM | POA: Diagnosis not present
